# Patient Record
Sex: Male | Born: 1950 | Race: White | Hispanic: No | Marital: Married | Smoking: Never smoker
Health system: Southern US, Community
[De-identification: ages and names within clinical notes are randomized; demographics above are authoritative.]

## PROBLEM LIST (undated history)

## (undated) DIAGNOSIS — Z7982 Long term (current) use of aspirin: Secondary | ICD-10-CM

## (undated) DIAGNOSIS — N529 Male erectile dysfunction, unspecified: Secondary | ICD-10-CM

## (undated) DIAGNOSIS — E785 Hyperlipidemia, unspecified: Secondary | ICD-10-CM

## (undated) DIAGNOSIS — Z972 Presence of dental prosthetic device (complete) (partial): Secondary | ICD-10-CM

## (undated) DIAGNOSIS — D369 Benign neoplasm, unspecified site: Secondary | ICD-10-CM

## (undated) DIAGNOSIS — I10 Essential (primary) hypertension: Secondary | ICD-10-CM

## (undated) DIAGNOSIS — R001 Bradycardia, unspecified: Secondary | ICD-10-CM

## (undated) DIAGNOSIS — K219 Gastro-esophageal reflux disease without esophagitis: Secondary | ICD-10-CM

## (undated) DIAGNOSIS — B029 Zoster without complications: Secondary | ICD-10-CM

## (undated) DIAGNOSIS — I2510 Atherosclerotic heart disease of native coronary artery without angina pectoris: Secondary | ICD-10-CM

## (undated) DIAGNOSIS — N200 Calculus of kidney: Secondary | ICD-10-CM

## (undated) HISTORY — PX: WISDOM TOOTH EXTRACTION: SHX21

## (undated) HISTORY — PX: COLONOSCOPY: SHX174

---

## 1995-08-22 HISTORY — PX: ESOPHAGOGASTRODUODENOSCOPY: SHX1529

## 2018-06-24 ENCOUNTER — Other Ambulatory Visit: Payer: Self-pay | Admitting: Internal Medicine

## 2018-06-24 DIAGNOSIS — R109 Unspecified abdominal pain: Secondary | ICD-10-CM

## 2018-06-24 DIAGNOSIS — R100 Acute abdomen: Secondary | ICD-10-CM

## 2018-06-25 ENCOUNTER — Ambulatory Visit: Payer: BLUE CROSS/BLUE SHIELD

## 2018-07-09 ENCOUNTER — Ambulatory Visit: Payer: Self-pay | Admitting: General Surgery

## 2018-07-16 ENCOUNTER — Ambulatory Visit: Payer: Self-pay | Admitting: General Surgery

## 2018-08-27 ENCOUNTER — Encounter: Payer: Self-pay | Admitting: *Deleted

## 2019-02-14 DIAGNOSIS — Z860101 Personal history of adenomatous and serrated colon polyps: Secondary | ICD-10-CM | POA: Insufficient documentation

## 2019-02-22 ENCOUNTER — Emergency Department: Payer: Medicare Other

## 2019-02-22 ENCOUNTER — Emergency Department
Admission: EM | Admit: 2019-02-22 | Discharge: 2019-02-22 | Disposition: A | Discharge status: Home / Self Care | Payer: Medicare Other | Attending: Emergency Medicine | Admitting: Emergency Medicine

## 2019-02-22 DIAGNOSIS — Y999 Unspecified external cause status: Secondary | ICD-10-CM | POA: Diagnosis not present

## 2019-02-22 DIAGNOSIS — Y9389 Activity, other specified: Secondary | ICD-10-CM | POA: Diagnosis not present

## 2019-02-22 DIAGNOSIS — S43014A Anterior dislocation of right humerus, initial encounter: Secondary | ICD-10-CM

## 2019-02-22 DIAGNOSIS — S4991XA Unspecified injury of right shoulder and upper arm, initial encounter: Secondary | ICD-10-CM | POA: Diagnosis present

## 2019-02-22 DIAGNOSIS — X501XXA Overexertion from prolonged static or awkward postures, initial encounter: Secondary | ICD-10-CM | POA: Insufficient documentation

## 2019-02-22 DIAGNOSIS — Y92814 Boat as the place of occurrence of the external cause: Secondary | ICD-10-CM | POA: Insufficient documentation

## 2019-02-22 MED ORDER — PROPOFOL 10 MG/ML IV BOLUS: 1.0000 mg/kg | Freq: Once | INTRAVENOUS | Status: DC

## 2019-02-22 MED ORDER — ETOMIDATE 2 MG/ML IV SOLN
INTRAVENOUS | Status: AC
  Filled 2019-02-22: qty 10

## 2019-02-22 MED ORDER — FENTANYL CITRATE (PF) 100 MCG/2ML IJ SOLN
50.0000 ug | Freq: Once | INTRAMUSCULAR | Status: AC
  Administered 2019-02-22: 10:00:00 50 ug via INTRAVENOUS
  Filled 2019-02-22: qty 2

## 2019-02-22 MED ORDER — ETOMIDATE 2 MG/ML IV SOLN
INTRAVENOUS | Status: AC | PRN
  Administered 2019-02-22: 15 mg via INTRAVENOUS

## 2019-02-22 MED ORDER — ETOMIDATE 2 MG/ML IV SOLN: 15.0000 mg | Freq: Once | INTRAVENOUS | Status: DC

## 2019-02-22 MED ORDER — PROPOFOL 10 MG/ML IV BOLUS
INTRAVENOUS | Status: AC
  Filled 2019-02-22: qty 20

## 2019-02-22 MED ORDER — NAPROXEN 500 MG PO TABS: 500.0000 mg | ORAL_TABLET | Freq: Two times a day (BID) | ORAL | 0 refills | Status: DC

## 2019-02-22 MED ORDER — SODIUM CHLORIDE 0.9 % IV BOLUS
1000.0000 mL | Freq: Once | INTRAVENOUS | Status: AC
  Administered 2019-02-22: 1000 mL via INTRAVENOUS

## 2019-02-22 MED ORDER — KETOROLAC TROMETHAMINE 30 MG/ML IJ SOLN
15.0000 mg | INTRAMUSCULAR | Status: AC
  Administered 2019-02-22: 13:00:00 15 mg via INTRAVENOUS
  Filled 2019-02-22: qty 1

## 2019-02-22 MED ORDER — HYDROMORPHONE HCL 1 MG/ML IJ SOLN
1.0000 mg | Freq: Once | INTRAMUSCULAR | Status: AC
  Administered 2019-02-22: 11:00:00 1 mg via INTRAVENOUS
  Filled 2019-02-22: qty 1

## 2019-02-22 MED ORDER — PROPOFOL 10 MG/ML IV BOLUS
INTRAVENOUS | Status: AC | PRN
  Administered 2019-02-22: 40 mg via INTRAVENOUS
  Administered 2019-02-22: 80 mg via INTRAVENOUS
  Administered 2019-02-22: 40 mg via INTRAVENOUS

## 2019-02-22 MED ORDER — ONDANSETRON HCL 4 MG/2ML IJ SOLN
4.0000 mg | Freq: Once | INTRAMUSCULAR | Status: AC
  Administered 2019-02-22: 11:00:00 4 mg via INTRAVENOUS
  Filled 2019-02-22: qty 2

## 2019-02-22 NOTE — ED Provider Notes (Signed)
[facility] Provider Note  ____________________________________________  Time seen: Approximately 10:51 AM  I have reviewed the triage vital signs and the nursing notes.   HISTORY  Chief Complaint Shoulder Injury    HPI Kenneth Pena is a [age] y.o. male with no significant medical history who reports reaching out to grab a dock yesterday which caused a pop in the shoulder and sudden severe pain.  Worse with any attempted range of motion, alleviated by holding the arm still.  Pain is severe and nonradiating.  Constant since yesterday.  Denies any history of shoulder injury or dislocation.      History reviewed. No pertinent past medical history. Has medical history noncontributory  There are no active problems to display for this patient.       Prior to Admission medications   Medication Sig Start Date End Date Taking? Authorizing Provider  naproxen (NAPROSYN) 500 MG tablet Take 1 tablet (500 mg total) by mouth 2 (two) times daily with a meal. 02/22/19   Sharman CheekStafford, Germani Gavilanes, MD  None  Allergies Patient has no allergy information on record.   History reviewed. No pertinent family history.  Social History Social History   Tobacco Use  . Smoking status: Not on file  Substance Use Topics  . Alcohol use: Not on file  . Drug use: Not on file  No significant tobacco alcohol or drug use  Review of Systems  Constitutional:   No fever or chills.  ENT:   No sore throat. No rhinorrhea. Cardiovascular:   No chest pain or syncope. Respiratory:   No dyspnea or cough. Gastrointestinal:   Negative for abdominal pain, vomiting and diarrhea.  Musculoskeletal:   Right shoulder pain as above All other systems reviewed and are negative except as documented above in ROS and HPI.  ____________________________________________   PHYSICAL EXAM:  VITAL SIGNS: ED Triage Vitals  Enc Vitals Group     BP 02/22/19 0931 (!) 207/89     Pulse  Rate 02/22/19 0931 (!) 58     Resp 02/22/19 0931 (!) 22     Temp 02/22/19 0931 98 F (36.7 C)     Temp Source 02/22/19 0931 Oral     SpO2 02/22/19 0931 97 %     Weight --      Height 02/22/19 0935 6' (1.829 m)     Head Circumference --      Peak Flow --      Pain Score 02/22/19 0935 10     Pain Loc --      Pain Edu? --      Excl. in GC? --     Vital signs reviewed, nursing assessments reviewed.   Constitutional:   Alert and oriented. Non-toxic appearance. Eyes:   Conjunctivae are normal. EOMI. PERRL. ENT      Head:   Normocephalic and atraumatic.      Nose:   No congestion/rhinnorhea.       Mouth/Throat:   MMM, no pharyngeal erythema. No peritonsillar mass.       Neck:   No meningismus. Full ROM. Hematological/Lymphatic/Immunilogical:   No cervical lymphadenopathy. Cardiovascular:   RRR. Symmetric bilateral radial and DP pulses.  No murmurs. Cap refill less than 2 seconds. Respiratory:   Normal respiratory effort without tachypnea/retractions. Breath sounds are clear and equal bilaterally. No wheezes/rales/rhonchi. Gastrointestinal:   Soft and nontender. Non distended. There is no CVA tenderness.  No rebound, rigidity, or guarding.  Musculoskeletal:   Right shoulder held in  abduction.  Humeral head is palpated anteriorly with emptiness of the glenoid fossa.  No focal bony tenderness. Neurologic:   Normal speech and language.  Motor grossly intact. Intact distal motor function and neurologic function in the right upper extremity.  Axillary nerve function intact. No acute focal neurologic deficits are appreciated.  Skin:    Skin is warm, dry and intact. No rash noted.  No petechiae, purpura, or bullae.  ____________________________________________    LABS (pertinent positives/negatives) (all labs ordered are listed, but only abnormal results are displayed) Labs Reviewed - No data to  display ____________________________________________   EKG    ____________________________________________    RADIOLOGY  Dg Shoulder Right  Result Date: 02/22/2019 CLINICAL DATA:  Post reduction EXAM: RIGHT SHOULDER - 2+ VIEW COMPARISON:  02/22/2019 FINDINGS: Normal alignment following reduction. No acute osseous finding or fracture by plain radiography. Mild AC joint degenerative change. Included right chest unremarkable. IMPRESSION: Adequate shoulder reduction. Electronically Signed   By: Judie PetitM.  Shick M.D.   On: 02/22/2019 12:23   Dg Shoulder Right  Result Date: 02/22/2019 CLINICAL DATA:  Pain and limited range of motion after injury yesterday. EXAM: RIGHT SHOULDER - 2+ VIEW COMPARISON:  None. FINDINGS: Complete anterior dislocation of the humeral head relative to the glenoid. No visible fracture. Other regional bone structures are negative for acute finding. Mild AC joint degenerative change. IMPRESSION: Complete anterior and slightly inferior dislocation of the humeral head relative to the glenoid. Electronically Signed   By: Paulina FusiMark  Shogry M.D.   On: 02/22/2019 10:24    ____________________________________________   PROCEDURES .Sedation  Date/Time: 02/22/2019 10:51 AM Performed by: Sharman CheekStafford, Audrina Marten, MD Authorized by: Sharman CheekStafford, Percilla Tweten, MD   Consent:    Consent obtained:  Verbal   Consent given by:  Patient   Risks discussed:  Allergic reaction, dysrhythmia, inadequate sedation, nausea, prolonged hypoxia resulting in organ damage, prolonged sedation necessitating reversal, respiratory compromise necessitating ventilatory assistance and intubation and vomiting   Alternatives discussed:  Analgesia without sedation, anxiolysis and regional anesthesia Universal protocol:    Procedure explained and questions answered to patient or proxy's satisfaction: yes     Relevant documents present and verified: yes     Test results available and properly labeled: yes     Imaging studies  available: yes     Required blood products, implants, devices, and special equipment available: yes     Site/side marked: yes     Immediately prior to procedure a time out was called: yes     Patient identity confirmation method:  Verbally with patient Indications:    Procedure performed:  Dislocation reduction   Procedure necessitating sedation performed by:  Physician performing sedation Pre-sedation assessment:    Time since last food or drink:  12+ hours   ASA classification: class 1 - normal, healthy patient     Neck mobility: normal     Mouth opening:  3 or more finger widths   Thyromental distance:  4 finger widths   Mallampati score:  I - soft palate, uvula, fauces, pillars visible   Pre-sedation assessments completed and reviewed: airway patency, cardiovascular function, hydration status, mental status, nausea/vomiting, pain level, respiratory function and temperature     Pre-sedation assessment completed:  02/22/2019 10:51 AM Immediate pre-procedure details:    Reassessment: Patient reassessed immediately prior to procedure     Reviewed: vital signs, relevant labs/tests and NPO status     Verified: bag valve mask available, emergency equipment available, intubation equipment available, IV patency confirmed, oxygen available  and suction available   Procedure details (see MAR for exact dosages):    Preoxygenation:  Nasal cannula   Sedation:  Propofol and etomidate   Analgesia:  Hydromorphone   Intra-procedure monitoring:  Blood pressure monitoring, cardiac monitor, continuous pulse oximetry, frequent LOC assessments, frequent vital sign checks and continuous capnometry   Intra-procedure events: none     Intra-procedure management:  Supplemental oxygen (2L  initiated due to so2 decreasing to 92% RA)   Total Provider sedation time (minutes):  25 Post-procedure details:    Post-sedation assessment completed:  02/22/2019 12:08 PM   Attendance: Constant attendance by certified staff  until patient recovered     Recovery: Patient returned to pre-procedure baseline     Post-sedation assessments completed and reviewed: airway patency, cardiovascular function, hydration status, mental status, nausea/vomiting, pain level, respiratory function and temperature     Patient is stable for discharge or admission: yes     Patient tolerance:  Tolerated well, no immediate complications Comments:     Minimal response to propofol in escalating doses totaling 2 mg/kg, so patient was given 15 mg of IV etomidate with adequate sedation.    [facility] Passey Injury Treatment  Date/Time: 02/22/2019 10:52 AM Performed by: Carrie Mew, MD Authorized by: Carrie Mew, MD   Consent:    Consent obtained:  Verbal   Consent given by:  Patient   Risks discussed:  Fracture, nerve damage, restricted joint movement, vascular damage, irreducible dislocation, stiffness and recurrent dislocation   Alternatives discussed:  Referral, immobilization and delayed treatmentInjury location: shoulder Location details: right shoulder Injury type: dislocation Dislocation type: anterior Hill-Sachs deformity: no Chronicity: new Pre-procedure neurovascular assessment: neurovascularly intact Pre-procedure distal perfusion: normal Pre-procedure neurological function: normal Pre-procedure range of motion: reduced  Anesthesia: Local anesthesia used: no  Patient sedated: Yes. Refer to sedation procedure documentation for details of sedation. Manipulation performed: yes Reduction method: traction and counter traction and external rotation Immobilization: sling (sling/immobilizer) Post-procedure neurovascular assessment: post-procedure neurovascularly intact Post-procedure distal perfusion: normal Post-procedure neurological function: normal Post-procedure range of motion: normal Patient tolerance: patient tolerated the procedure well with no immediate  complications     ____________________________________________    CLINICAL IMPRESSION / ASSESSMENT AND PLAN / ED COURSE  Medications ordered in the ED: Medications  propofol (DIPRIVAN) 10 mg/mL bolus/IV push 79.4 mg (has no administration in time range)  etomidate (AMIDATE) injection 15 mg (has no administration in time range)  fentaNYL (SUBLIMAZE) injection 50 mcg (50 mcg Intravenous Given 02/22/19 0954)  HYDROmorphone (DILAUDID) injection 1 mg (1 mg Intravenous Given 02/22/19 1118)  sodium chloride 0.9 % bolus 1,000 mL (0 mLs Intravenous Stopped 02/22/19 1217)  ondansetron (ZOFRAN) injection 4 mg (4 mg Intravenous Given 02/22/19 1118)  propofol (DIPRIVAN) 10 mg/mL bolus/IV push (40 mg Intravenous Given 02/22/19 1146)  etomidate (AMIDATE) injection (15 mg Intravenous Given 02/22/19 1153)  ketorolac (TORADOL) 30 MG/ML injection 15 mg (15 mg Intravenous Given 02/22/19 1246)    Pertinent labs & imaging results that were available during my care of the patient were reviewed by me and considered in my medical decision making (see chart for details).  DOMONIC KIMBALL was evaluated in Emergency Department on 02/22/2019 for the symptoms described in the history of present illness. He was evaluated in the context of the global COVID-19 pandemic, which necessitated consideration that the patient might be at risk for infection with the SARS-CoV-2 virus that causes COVID-19. Institutional protocols and algorithms that pertain to the evaluation of patients at risk for COVID-19 are  in a state of rapid change based on information released by regulatory bodies including the CDC and federal and state organizations. These policies and algorithms were followed during the patient's care in the ED.   Patient presents with anterior dislocation of right shoulder.  Consented for deep sedation and reduction.  Clinical Course as of Feb 22 1324  Sat Feb 22, 2019  1233 Repeat shoulder x-ray viewed by me, shows adequate  reduction as confirmed by radiology.  Shoulder joint is located.  There does appear to be increased acromiohumeral separation superiorly suggestive of ligamentous laxity or disruption.  He will need to follow-up with orthopedics.   [PS]    Clinical Course User Index [PS] Sharman CheekStafford, Taha Dimond, MD     ----------------------------------------- 1:25 PM on 02/22/2019 -----------------------------------------  Back to baseline, stable for discharge, Post sedation discharge instructions given.  Immobilizer on for a week continuously, follow-up with orthopedics.  NSAIDs for pain.  ____________________________________________   FINAL CLINICAL IMPRESSION(S) / ED DIAGNOSES    Final diagnoses:  Closed anterior dislocation of right shoulder, initial encounter     ED Discharge Orders         Ordered    naproxen (NAPROSYN) 500 MG tablet  2 times daily with meals     02/22/19 1320          Portions of this note were generated with dragon dictation software. Dictation errors may occur despite best attempts at proofreading.   Sharman CheekStafford, Sybil Shrader, MD 02/22/19 1326

## 2019-02-22 NOTE — Sedation Documentation (Signed)
Les, EDT at bedside to assist with procedure

## 2019-02-22 NOTE — ED Triage Notes (Signed)
Pt arrives POV to ED with c/o right shoulder pain.pt reports being on a boat and trying to grab the dock as the boat came in. Pt states his shoulder popped and has been hurting since yesterday. Pt woke up this morning unable to hold his arm in place and feels like his shoulder is out of socket. Pt is in immense pain at this time.

## 2019-02-22 NOTE — Discharge Instructions (Signed)
Keep the shoulder immobilizer on at all times for the next week and follow up with orthopedics for further evaluation.  You may remove the immobilizer to bathe, but you should not attempt to raise your arm, and instead allow it to hang by your side while you bathe.

## 2019-02-22 NOTE — Sedation Documentation (Signed)
Called and updated pt's fiance, Katharine Look.

## 2019-02-22 NOTE — Sedation Documentation (Signed)
X-ray at bedside

## 2019-02-22 NOTE — Sedation Documentation (Signed)
Patient alert and talking with RN. Pt is in NAD at this time. Pt reports that his pain is better, still having some numbness in his fingers.

## 2019-02-22 NOTE — ED Notes (Addendum)
Pt reports that he was trying to grab the dock as boat was docking, pt reports as soon as he put pressure on his arm he felt something pop in his shoulder. Pt states that he is having severe pain in his shoulder and that the pain medication he was given by flex RN is not helping with his pain. Pt is in bed with arm elevated at this time, does not appear to be in any distress. Skin color is WNL, radial pulse present and strong, skin is warm and dry.

## 2019-03-05 ENCOUNTER — Other Ambulatory Visit: Payer: Self-pay | Admitting: Orthopedic Surgery

## 2019-03-05 DIAGNOSIS — M25511 Pain in right shoulder: Secondary | ICD-10-CM

## 2019-03-18 ENCOUNTER — Other Ambulatory Visit: Payer: Self-pay

## 2019-03-18 ENCOUNTER — Ambulatory Visit
Admission: RE | Admit: 2019-03-18 | Discharge: 2019-03-18 | Disposition: A | Discharge status: Home / Self Care | Payer: Medicare Other | Source: Ambulatory Visit | Attending: Orthopedic Surgery | Admitting: Orthopedic Surgery

## 2019-03-18 DIAGNOSIS — M25511 Pain in right shoulder: Secondary | ICD-10-CM | POA: Diagnosis present

## 2019-03-24 ENCOUNTER — Other Ambulatory Visit: Payer: Self-pay

## 2019-03-24 ENCOUNTER — Encounter: Payer: Self-pay | Admitting: *Deleted

## 2019-03-25 ENCOUNTER — Other Ambulatory Visit
Admission: RE | Admit: 2019-03-25 | Discharge: 2019-03-25 | Disposition: A | Discharge status: Home / Self Care | Payer: Medicare Other | Source: Ambulatory Visit | Attending: Orthopedic Surgery | Admitting: Orthopedic Surgery

## 2019-03-25 DIAGNOSIS — Z01812 Encounter for preprocedural laboratory examination: Secondary | ICD-10-CM | POA: Diagnosis present

## 2019-03-25 DIAGNOSIS — Z20828 Contact with and (suspected) exposure to other viral communicable diseases: Secondary | ICD-10-CM | POA: Insufficient documentation

## 2019-03-25 LAB — SARS CORONAVIRUS 2 (TAT 6-24 HRS): SARS Coronavirus 2: NEGATIVE

## 2019-03-25 NOTE — Anesthesia Preprocedure Evaluation (Addendum)
Anesthesia Evaluation  Patient identified by MRN, date of birth, ID band Patient awake    Reviewed: Allergy & Precautions, NPO status , Patient's Chart, lab work & pertinent test results  History of Anesthesia Complications Negative for: history of anesthetic complications  Airway Mallampati: II   Neck ROM: Full    Dental  (+) Implants   Pulmonary neg pulmonary ROS,    Pulmonary exam normal breath sounds clear to auscultation       Cardiovascular Exercise Tolerance: Good negative cardio ROS Normal cardiovascular exam Rhythm:Regular Rate:Normal     Neuro/Psych negative neurological ROS     GI/Hepatic negative GI ROS,   Endo/Other  negative endocrine ROS  Renal/GU Renal disease (hx nephrolithiasis)     Musculoskeletal   Abdominal   Peds  Hematology negative hematology ROS (+)   Anesthesia Other Findings   Reproductive/Obstetrics                            Anesthesia Physical Anesthesia Plan  ASA: I  Anesthesia Plan: General and Regional   Post-op Pain Management:  Regional for Post-op pain and GA combined w/ Regional for post-op pain   Induction: Intravenous  PONV Risk Score and Plan: 2 and Dexamethasone and Ondansetron  Airway Management Planned: Oral ETT  Additional Equipment:   Intra-op Plan:   Post-operative Plan: Extubation in OR  Informed Consent: I have reviewed the patients History and Physical, chart, labs and discussed the procedure including the risks, benefits and alternatives for the proposed anesthesia with the patient or authorized representative who has indicated his/her understanding and acceptance.       Plan Discussed with: CRNA  Anesthesia Plan Comments: (Patient hypertensive in preop to 189/99 with HR 50.  Giving hydralazine.)       Anesthesia Quick Evaluation

## 2019-03-28 ENCOUNTER — Ambulatory Visit: Payer: Medicare Other | Admitting: Anesthesiology

## 2019-03-28 ENCOUNTER — Encounter
Admission: RE | Disposition: A | Discharge status: Home / Self Care | Payer: Self-pay | Source: Home / Self Care | Attending: Orthopedic Surgery

## 2019-03-28 ENCOUNTER — Ambulatory Visit
Admission: RE | Admit: 2019-03-28 | Discharge: 2019-03-28 | Disposition: A | Discharge status: Home / Self Care | Payer: Medicare Other | Attending: Orthopedic Surgery | Admitting: Orthopedic Surgery

## 2019-03-28 DIAGNOSIS — X509XXA Other and unspecified overexertion or strenuous movements or postures, initial encounter: Secondary | ICD-10-CM | POA: Insufficient documentation

## 2019-03-28 DIAGNOSIS — S43491A Other sprain of right shoulder joint, initial encounter: Secondary | ICD-10-CM | POA: Diagnosis not present

## 2019-03-28 DIAGNOSIS — Z8249 Family history of ischemic heart disease and other diseases of the circulatory system: Secondary | ICD-10-CM | POA: Diagnosis not present

## 2019-03-28 DIAGNOSIS — Z87442 Personal history of urinary calculi: Secondary | ICD-10-CM | POA: Diagnosis not present

## 2019-03-28 DIAGNOSIS — M7541 Impingement syndrome of right shoulder: Secondary | ICD-10-CM | POA: Diagnosis not present

## 2019-03-28 DIAGNOSIS — M19011 Primary osteoarthritis, right shoulder: Secondary | ICD-10-CM | POA: Insufficient documentation

## 2019-03-28 DIAGNOSIS — Y9239 Other specified sports and athletic area as the place of occurrence of the external cause: Secondary | ICD-10-CM | POA: Diagnosis not present

## 2019-03-28 DIAGNOSIS — M75101 Unspecified rotator cuff tear or rupture of right shoulder, not specified as traumatic: Secondary | ICD-10-CM | POA: Diagnosis present

## 2019-03-28 DIAGNOSIS — Z86010 Personal history of colonic polyps: Secondary | ICD-10-CM | POA: Insufficient documentation

## 2019-03-28 DIAGNOSIS — M1990 Unspecified osteoarthritis, unspecified site: Secondary | ICD-10-CM | POA: Insufficient documentation

## 2019-03-28 DIAGNOSIS — Z79899 Other long term (current) drug therapy: Secondary | ICD-10-CM | POA: Insufficient documentation

## 2019-03-28 DIAGNOSIS — S46011A Strain of muscle(s) and tendon(s) of the rotator cuff of right shoulder, initial encounter: Secondary | ICD-10-CM | POA: Diagnosis not present

## 2019-03-28 HISTORY — DX: Calculus of kidney: N20.0

## 2019-03-28 HISTORY — DX: Presence of dental prosthetic device (complete) (partial): Z97.2

## 2019-03-28 HISTORY — PX: SHOULDER ARTHROSCOPY WITH OPEN ROTATOR CUFF REPAIR: SHX6092

## 2019-03-28 SURGERY — ARTHROSCOPY, SHOULDER WITH REPAIR, ROTATOR CUFF, OPEN
Anesthesia: Regional | Site: Shoulder | Laterality: Right

## 2019-03-28 MED ORDER — GLYCOPYRROLATE 0.2 MG/ML IJ SOLN
INTRAMUSCULAR | Status: DC | PRN
  Administered 2019-03-28: 0.2 mg via INTRAVENOUS

## 2019-03-28 MED ORDER — CEFAZOLIN SODIUM-DEXTROSE 2-4 GM/100ML-% IV SOLN
2.0000 g | Freq: Once | INTRAVENOUS | Status: AC
  Administered 2019-03-28: 2 g via INTRAVENOUS

## 2019-03-28 MED ORDER — FENTANYL CITRATE (PF) 100 MCG/2ML IJ SOLN: 25.0000 ug | INTRAMUSCULAR | Status: DC | PRN

## 2019-03-28 MED ORDER — OXYCODONE HCL 5 MG PO TABS: 5.0000 mg | ORAL_TABLET | ORAL | 0 refills | Status: AC | PRN

## 2019-03-28 MED ORDER — ONDANSETRON HCL 4 MG/2ML IJ SOLN: 4.0000 mg | Freq: Once | INTRAMUSCULAR | Status: DC | PRN

## 2019-03-28 MED ORDER — DEXMEDETOMIDINE HCL 200 MCG/2ML IV SOLN
INTRAVENOUS | Status: DC | PRN
  Administered 2019-03-28: 10 ug via INTRAVENOUS

## 2019-03-28 MED ORDER — ACETAMINOPHEN 500 MG PO TABS: 1000.0000 mg | ORAL_TABLET | Freq: Three times a day (TID) | ORAL | 2 refills | Status: AC

## 2019-03-28 MED ORDER — ONDANSETRON HCL 4 MG/2ML IJ SOLN
INTRAMUSCULAR | Status: DC | PRN
  Administered 2019-03-28: 4 mg via INTRAVENOUS

## 2019-03-28 MED ORDER — HYDRALAZINE HCL 20 MG/ML IJ SOLN
10.0000 mg | Freq: Once | INTRAMUSCULAR | Status: AC
  Administered 2019-03-28: 10 mg via INTRAVENOUS

## 2019-03-28 MED ORDER — PROPOFOL 10 MG/ML IV BOLUS
INTRAVENOUS | Status: DC | PRN
  Administered 2019-03-28: 150 mg via INTRAVENOUS
  Administered 2019-03-28: 50 mg via INTRAVENOUS

## 2019-03-28 MED ORDER — MIDAZOLAM HCL 5 MG/5ML IJ SOLN
INTRAMUSCULAR | Status: DC | PRN
  Administered 2019-03-28: 2 mg via INTRAVENOUS

## 2019-03-28 MED ORDER — PHENYLEPHRINE HCL (PRESSORS) 10 MG/ML IV SOLN
INTRAVENOUS | Status: DC | PRN
  Administered 2019-03-28: 50 ug via INTRAVENOUS
  Administered 2019-03-28 (×3): 100 ug via INTRAVENOUS

## 2019-03-28 MED ORDER — LACTATED RINGERS IV SOLN
INTRAVENOUS | Status: DC | PRN
  Administered 2019-03-28: 4 mL

## 2019-03-28 MED ORDER — ONDANSETRON 4 MG PO TBDP: 4.0000 mg | ORAL_TABLET | Freq: Three times a day (TID) | ORAL | 0 refills | Status: DC | PRN

## 2019-03-28 MED ORDER — ACETAMINOPHEN 10 MG/ML IV SOLN: 1000.0000 mg | Freq: Once | INTRAVENOUS | Status: DC | PRN

## 2019-03-28 MED ORDER — OXYCODONE HCL 5 MG/5ML PO SOLN: 5.0000 mg | Freq: Once | ORAL | Status: AC | PRN

## 2019-03-28 MED ORDER — FENTANYL CITRATE (PF) 100 MCG/2ML IJ SOLN
INTRAMUSCULAR | Status: DC | PRN
  Administered 2019-03-28: 50 ug via INTRAVENOUS

## 2019-03-28 MED ORDER — BUPIVACAINE LIPOSOME 1.3 % IJ SUSP
INTRAMUSCULAR | Status: DC | PRN
  Administered 2019-03-28: 20 mL

## 2019-03-28 MED ORDER — ROPIVACAINE HCL 5 MG/ML IJ SOLN
INTRAMUSCULAR | Status: DC | PRN
  Administered 2019-03-28: 10 mL via EPIDURAL

## 2019-03-28 MED ORDER — LACTATED RINGERS IV SOLN
INTRAVENOUS | Status: DC
  Administered 2019-03-28: 07:00:00 via INTRAVENOUS

## 2019-03-28 MED ORDER — ASPIRIN EC 325 MG PO TBEC: 325.0000 mg | DELAYED_RELEASE_TABLET | Freq: Every day | ORAL | 0 refills | Status: AC

## 2019-03-28 MED ORDER — LACTATED RINGERS IV SOLN: INTRAVENOUS | Status: DC

## 2019-03-28 MED ORDER — OXYCODONE HCL 5 MG PO TABS
5.0000 mg | ORAL_TABLET | Freq: Once | ORAL | Status: AC | PRN
  Administered 2019-03-28: 12:00:00 5 mg via ORAL

## 2019-03-28 SURGICAL SUPPLY — 63 items
ADAPTER IRRIG TUBE 2 SPIKE SOL (ADAPTER) ×4 IMPLANT
ANCHOR ICONIX SPEED 2.3 (Anchor) ×1 IMPLANT
BUR BR 5.5 12 FLUTE (BURR) ×1 IMPLANT
BUR RADIUS 4.0X18.5 (BURR) ×1 IMPLANT
CANNULA PART THRD DISP 5.75X7 (CANNULA) ×1 IMPLANT
CANNULA PARTIAL THREAD 2X7 (CANNULA) IMPLANT
CANNULA TWIST IN 8.25X9CM (CANNULA) IMPLANT
CHLORAPREP W/TINT 26 (MISCELLANEOUS) ×2 IMPLANT
COOLER POLAR GLACIER W/PUMP (MISCELLANEOUS) ×2 IMPLANT
COVER LIGHT HANDLE UNIVERSAL (MISCELLANEOUS) ×4 IMPLANT
DERMABOND ADVANCED (GAUZE/BANDAGES/DRESSINGS)
DERMABOND ADVANCED .7 DNX12 (GAUZE/BANDAGES/DRESSINGS) IMPLANT
DRAPE IMP U-DRAPE 54X76 (DRAPES) ×3 IMPLANT
DRAPE INCISE IOBAN 66X45 STRL (DRAPES) ×2 IMPLANT
DRAPE SHEET LG 3/4 BI-LAMINATE (DRAPES) ×2 IMPLANT
DRAPE U-SHAPE 48X52 POLY STRL (PACKS) ×4 IMPLANT
ELECT REM PT RETURN 9FT ADLT (ELECTROSURGICAL) ×2
ELECTRODE REM PT RTRN 9FT ADLT (ELECTROSURGICAL) ×1 IMPLANT
GAUZE SPONGE 4X4 12PLY STRL (GAUZE/BANDAGES/DRESSINGS) ×2 IMPLANT
GAUZE XEROFORM 1X8 LF (GAUZE/BANDAGES/DRESSINGS) ×2 IMPLANT
GLOVE BIOGEL PI IND STRL 8 (GLOVE) ×1 IMPLANT
GLOVE BIOGEL PI INDICATOR 8 (GLOVE) ×4
GOWN STRL REUS W/ TWL LRG LVL3 (GOWN DISPOSABLE) ×1 IMPLANT
GOWN STRL REUS W/TWL LRG LVL3 (GOWN DISPOSABLE) ×3
IMP SYSTEM BRIDGE 4.75X19.1 (Anchor) ×2 IMPLANT
IMPL SYSTEM BRIDGE 4.75X19.1 (Anchor) IMPLANT
IV LACTATED RINGER IRRG 3000ML (IV SOLUTION) ×16
IV LR IRRIG 3000ML ARTHROMATIC (IV SOLUTION) ×8 IMPLANT
KIT STABILIZATION SHOULDER (MISCELLANEOUS) ×2 IMPLANT
KIT TURNOVER KIT A (KITS) ×2 IMPLANT
MANIFOLD 4PT FOR NEPTUNE1 (MISCELLANEOUS) ×2 IMPLANT
MASK FACE SPIDER DISP (MASK) ×2 IMPLANT
MAT ABSORB  FLUID 56X50 GRAY (MISCELLANEOUS) ×2
MAT ABSORB FLUID 56X50 GRAY (MISCELLANEOUS) ×2 IMPLANT
NDL SAFETY ECLIPSE 18X1.5 (NEEDLE) ×1 IMPLANT
NDL SCORPION MULTI FIRE (NEEDLE) IMPLANT
NEEDLE HYPO 18GX1.5 SHARP (NEEDLE) ×1
NEEDLE SCORPION MULTI FIRE (NEEDLE) ×2 IMPLANT
PACK ARTHROSCOPY SHOULDER (MISCELLANEOUS) ×2 IMPLANT
PAD WRAPON POLAR SHDR UNIV (MISCELLANEOUS) ×1 IMPLANT
PENCIL SMOKE EVACUATOR (MISCELLANEOUS) ×2 IMPLANT
SET TUBE SUCT SHAVER OUTFL 24K (TUBING) ×2 IMPLANT
SET TUBE TIP INTRA-ARTICULAR (MISCELLANEOUS) ×2 IMPLANT
STAPLER SKIN PROX 35W (STAPLE) IMPLANT
STRIP CLOSURE SKIN 1/2X4 (GAUZE/BANDAGES/DRESSINGS) IMPLANT
SUT ETHILON 3-0 (SUTURE) ×1 IMPLANT
SUT LASSO 90 DEG SD STR (SUTURE) IMPLANT
SUT MNCRL 4-0 (SUTURE) ×1
SUT MNCRL 4-0 27XMFL (SUTURE) ×1
SUT TICRON 2-0 30IN 311381 (SUTURE) IMPLANT
SUT VIC AB 0 CT1 36 (SUTURE) ×1 IMPLANT
SUT VIC AB 2-0 CT2 27 (SUTURE) ×1 IMPLANT
SUT VICRYL 3-0 27IN (SUTURE) IMPLANT
SUTURE MNCRL 4-0 27XMF (SUTURE) IMPLANT
SUTURE TAPE 1.3 40 TPR END (SUTURE) IMPLANT
SUTURETAPE 1.3 40 TPR END (SUTURE) ×8
SYR 10ML LL (SYRINGE) ×2 IMPLANT
SYSTEM FBRTK BICEPS 1.9 DRILL (Anchor) ×1 IMPLANT
TAPE MICROFOAM 4IN (TAPE) ×2 IMPLANT
TUBING ARTHRO INFLOW-ONLY STRL (TUBING) ×2 IMPLANT
TUBING CONNECTING 10 (TUBING) ×2 IMPLANT
WAND WEREWOLF FLOW 90D (MISCELLANEOUS) ×2 IMPLANT
WRAPON POLAR PAD SHDR UNIV (MISCELLANEOUS) ×2

## 2019-03-28 NOTE — Progress Notes (Signed)
Assisted Andrea Mazzoni, ANMD with right, ultrasound guided, supraclavicular block. Side rails up, monitors on throughout procedure. See vital signs in flow sheet. Tolerated Procedure well.  

## 2019-03-28 NOTE — Op Note (Signed)
SURGERY DATE: 03/28/2019  PRE-OP DIAGNOSIS:  1. Right subacromial impingement 2. Right biceps tendinopathy 3. Right rotator cuff tear 4. Right acromioclavicular joint osteoarthritis  POST-OP DIAGNOSIS: 1. Right subacromial impingement 2. Right biceps tendinopathy 3. Right rotator cuff tear (supraspinatus and infraspinatus) 4. Right acromioclavicular joint osteoarthritis  PROCEDURES:  1. Right mini-open rotator cuff repair 2. Right open biceps tenodesis 3. Right arthroscopic distal clavicle excision 4. Right arthroscopic extensive debridement of shoulder (glenohumeral and subacromial spaces) 5. Right arthroscopic subacromial decompression  SURGEON: Cato Mulligan, MD  ASSISTANT: Anitra Lauth, PA  ANESTHESIA: Gen with Exparil interscalene block  ESTIMATED BLOOD LOSS: 25cc  DRAINS:  none  TOTAL IV FLUIDS: per anesthesia   SPECIMENS: none  IMPLANTS:  - Arthrex 4.67m SwiveLock x4 - Arthrex 1.829mFiberTak - Iconix SPEED double loaded with 1.2 and 2.74m99mape x 1   OPERATIVE FINDINGS:  Examination under anesthesia: A careful examination under anesthesia was performed.  Passive range of motion was: FF: 150; ER at side: 45; ER in abduction: 80; IR in abduction: 40.  Anterior load shift: NT.  Posterior load shift: NT.  Sulcus in neutral: NT.  Sulcus in ER: NT.    Intra-operative findings: A thorough arthroscopic examination of the shoulder was performed.  The findings are: 1. Biceps tendon: tendinopathy with significant erythema and thickening proximally 2. Superior labrum: injected with surrounding synovitis, type II SLAP tear 3. Posterior labrum and capsule: normal 4. Inferior capsule and inferior recess: normal 5. Glenoid cartilage surface: Grade 1 changes with mild surface fibrillation  6. Supraspinatus attachment: full-thickness tear 7. Posterior rotator cuff attachment: Full-thickness tear of the infraspinatus 8. Humeral head articular cartilage: Grade 2 degenerative  changes, small Hill-Sachs lesion present 9. Rotator interval: significant synovitis 10: Subscapularis tendon: attachment intact 11. Anterior labrum: Torn and degenerative appearing 12. IGHL: normal  OPERATIVE REPORT:   Indications for procedure: RanMORIAH LOUGHRY a [age]o. male who sustained a glenohumeral joint dislocation on 02/22/2019.  The shoulder was reduced in the emergency department under sedation.  However on repeat evaluation, the patient had inability to lift his arm over his head and significant weakness with external rotation with positive external rotation lag sign.  An MRI was obtained that showed a massive rotator cuff tear involving the supraspinatus and infraspinatus.  Additionally, there was concern for biceps tendinopathy, symptomatic AC joint arthritis, and subacromial impingement. After discussion of risks, benefits, and alternatives to surgery, the patient elected to proceed.   Procedure in detail:  I identified RanREG BIRCHER the pre-operative holding area.  I marked the operative shoulder with my initials. I reviewed the risks and benefits of the proposed surgical intervention, and the patient (and/or patient's guardian) wished to proceed.  Anesthesia was then performed with an Exparil interscalene block.  The patient was transferred to the operative suite and placed in the beach chair position.    SCDs were placed on the lower extremities. Appropriate IV antibiotics were administered prior to incision. The operative upper extremity was then prepped and draped in standard fashion. A time out was performed confirming the correct extremity, correct patient, and correct procedure.   I then created a standard posterior portal with an 11 blade. The glenohumeral joint was easily entered with a blunt trochar and the arthroscope introduced. The findings of diagnostic arthroscopy are described above. I debrided degenerative tissue including the synovitic tissue about the rotator  interval and anterior and superior labrum. I then coagulated the inflamed synovium to obtain  hemostasis and reduce the risk of post-operative swelling using an Arthrocare radiofrequency device. I performed a biceps tenotomy using an arthroscopic scissors and used a motorized shaver to debride the stump back to a stable base.   Next, the arthroscope was then introduced into the subacromial space. A direct lateral portal was created with an 11-blade after spinal needle localization. An extensive subacromial bursectomy was performed using a combination of the shaver and Arthrocare wand. The entire acromial undersurface was exposed and the CA ligament was subperiosteally elevated to expose the anterior acromial hook. A 5.69m barrel burr was used to create a flat anterior and lateral aspect of the acromion, converting it from a Type 2 to a Type 1 acromion. Care was made to keep the deltoid fascia intact.  I then turned my attention to the arthroscopic distal clavicle excision. I identified the acromioclavicular joint. Surrounding bursal tissue was debrided and the edges of the joint were identified. I used the 5.54mbarrel burr to remove the distal clavicle parallel to the edge of the acromion. I was able to fit two widths of the burr into the space between the distal clavicle and acromion, signifying that I had removed ~1144mf distal clavicle. This was confirmed by viewing anteriorly and introducing a probe with measuring marks from the lateral portal. Hemostasis was achieved with an Arthrocare wand. Fluid was evacuated from the shoulder.  This concluded the arthroscopic portion of the procedure.  A longitudinal incision from the anterolateral acromion ~7cm in length was made overlying the raphe between the anterior and middle heads of the deltoid. The raphe was identified and it was incised. The subacromial space was identified. Any remaining bursa was excised. The rotator cuff tear was identified.  There was an  additional, more medial tear such that the lateral aspect of the tendon was a very poor quality.  This portion was excised as it was unlikely to heal.  This resulted in deep V-shaped tear with medial extent medial to the apex of the humeral head.  We then turned our attention to the biceps tenodesis. The arm was externally rotated.  The bicipital groove was identified.  A 15 blade was used to make a cut overlying the biceps tendon, and the tendon was removed using a right angle clamp.  The base of the bicipital groove was identified and cleared of soft tissue.  A FiberTak anchor was placed in the bicipital groove.  The biceps tendon was held at the appropriate amount of tension.  One set of sutures was passed through the biceps anchor with one limb passed in a simple fashion and the second limb passed in a simple plus locking stitch pattern.  This was repeated for the other set of sutures.  This construct allowed for shuttling the biceps tendon down to the bone.  The sutures were tied and cut.  The diseased portion of the proximal biceps was then excised.  The arm was then internally rotated.  The rotator cuff footprint was cleared of soft tissue. A rongeur was used to gently decorticate the rotator cuff footprint to allow for improved healing.  The rotator cuff was mobilized using key elevators both superior and inferior to the tear. Two margin suture convergence sutures were placed at the apex of the V shaped tear of the rotator cuff using suture tape.  Next medial row anchors from a SpeedBridge kit that were additionally loaded with suture tape were placed at the anterior and posterior aspects of the tear at the  articular margin.  Additionally, one Iconix SPEED anchor was placed centrally.   The #2 FiberWire sutures from both medial row SwiveLock anchors were removed and used to place additional margin convergence sutures more laterally.  This appropriately converted the tear to a U-shaped tear that could be  appropriately reduced to its footprint without undue tension.  All of the remaining sutures were passed appropriately through the rotator cuff.  The posterior anchor suture tapes were tied to reapproximate the infraspinatus down to the anchor.  Two SwiveLock anchors were placed for the lateral row anchors with remaining limbs of suture passed in a fashion that allowed for optimal reduction of the rotator cuff with appropriate compression. The anterior anchor #2 FiberWire from the SwiveLock anchor was additionally passed through a dog ear in the anterior aspect of the rotator cuff to further reapproximate the tear.  This construct was stable with external and internal rotation and allowed for appropriate reapproximation of the rotator cuff to its natural footprint..  The wound was thoroughly irrigated.  The deltoid split was closed with 0 Vicryl.  The subdermal layer was closed with 2-0 Vicryl.  The skin was closed with 4-0 Monocryl and Dermabond. The portals were closed with 3-0 Nylon. Xeroform was applied to the incisions. A sterile dressing was applied, followed by a Polar Care sleeve and a SlingShot shoulder immobilizer/sling. The patient was awakened from anesthesia without difficulty and was transferred to the PACU in stable condition.   Of note, assistance from a PA was essential to performing the surgery. PA assisted with patient positioning, retraction, and instrumentation. The surgery would have been more difficult and had longer operative time without PA assistance.   Additionally, this case had increased complexity compared to standard mini open rotator cuff repair given the more medial tear rendering the lateral most rotator cuff tissue unusable.  This required increased mobilization of the rotator cuff and additional margin convergence suturing.  Additionally, the large size of the tear required a third medial row anchor.  All of these factors increased surgical complexity and surgical time by  approximately 1 hour.   COMPLICATIONS: none  DISPOSITION: plan for discharge home after recovery in PACU   POSTOPERATIVE PLAN: Remain in sling (except hygiene and elbow/wrist/hand RoM exercises as instructed by PT) x 6 weeks and NWB for this time. PT to begin 3-4 days after surgery.  Use large/massive rotator cuff repair and biceps tenodesis rehab protocol. ASA '325mg'$  daily x 2 weeks for DVT ppx.

## 2019-03-28 NOTE — H&P (Signed)
Paper H&P to be scanned into permanent record. H&P reviewed. No significant changes noted.  

## 2019-03-28 NOTE — Anesthesia Postprocedure Evaluation (Signed)
Anesthesia Post Note  Patient: Kenneth Pena  Procedure(s) Performed: SHOULDER ARTHROSCOPIC SUBSCAPULARIS REPAIR, MINI OPEN ROTATOR CUFF REPAIR, SUBACROMIAL DECOMPRESSION, DISTAL CLAVICLE EXCISION AND BICEPS TENODESIS (Right Shoulder)  Patient location during evaluation: PACU Anesthesia Type: Regional Level of consciousness: awake and alert, oriented and patient cooperative Pain management: pain level controlled Vital Signs Assessment: post-procedure vital signs reviewed and stable Respiratory status: spontaneous breathing, nonlabored ventilation and respiratory function stable Cardiovascular status: blood pressure returned to baseline and stable Postop Assessment: adequate PO intake Anesthetic complications: no    Darrin Nipper

## 2019-03-28 NOTE — Transfer of Care (Signed)
Immediate Anesthesia Transfer of Care Note  Patient: DAILEY BUCCHERI  Procedure(s) Performed: SHOULDER ARTHROSCOPIC SUBSCAPULARIS REPAIR, MINI OPEN ROTATOR CUFF REPAIR, SUBACROMIAL DECOMPRESSION, DISTAL CLAVICLE EXCISION AND BICEPS TENODESIS (Right Shoulder)  Patient Location: PACU  Anesthesia Type: General, Regional  Level of Consciousness: awake, alert  and patient cooperative  Airway and Oxygen Therapy: Patient Spontanous Breathing and Patient connected to supplemental oxygen  Post-op Assessment: Post-op Vital signs reviewed, Patient's Cardiovascular Status Stable, Respiratory Function Stable, Patent Airway and No signs of Nausea or vomiting  Post-op Vital Signs: Reviewed and stable  Complications: No apparent anesthesia complications

## 2019-03-28 NOTE — Discharge Instructions (Signed)
Post-Op Instructions - Rotator Cuff Repair ° °1. Bracing: You will wear a shoulder immobilizer or sling for 6 weeks.  ° °2. Driving: No driving for 3 weeks post-op. When driving, do not wear the immobilizer. Ideally, we recommend no driving for 6 weeks while sling is in place as one arm will be immobilized.  ° °3. Activity: No active lifting for 2 months. Wrist, hand, and elbow motion only. Avoid lifting the upper arm away from the body except for hygiene. You are permitted to bend and straighten the elbow passively only (no active elbow motion). You may use your hand and wrist for typing, writing, and managing utensils (cutting food). Do not lift more than a coffee cup for 8 weeks.  When sleeping or resting, inclined positions (recliner chair or wedge pillow) and a pillow under the forearm for support may provide better comfort for up to 4 weeks.  Avoid long distance travel for 4 weeks. ° °Return to normal activities after rotator cuff repair repair normally takes 6 months on average. If rehab goes very well, may be able to do most activities at 4 months, except overhead or contact sports. ° °4. Physical Therapy: Begins 3-4 days after surgery, and proceed 1 time per week for the first 6 weeks, then 1-2 times per week from weeks 6-20 post-op. ° °5. Medications:  °- You will be provided a prescription for narcotic pain medicine. After surgery, take 1-2 narcotic tablets every 4 hours if needed for severe pain.  °- A prescription for anti-nausea medication will be provided in case the narcotic medicine causes nausea - take 1 tablet every 6 hours only if nauseated.   °- Take tylenol 1000 mg (2 Extra Strength tablets or 3 regular strength) every 8 hours for pain.  May decrease or stop tylenol 5 days after surgery if you are having minimal pain. °- Take ASA 325mg/day x 2 weeks to help prevent DVTs/PEs (blood clots).  °- DO NOT take ANY nonsteroidal anti-inflammatory pain medications (Advil, Motrin, Ibuprofen, Aleve,  Naproxen, or Naprosyn). These medicines can inhibit healing of your shoulder repair.  ° ° °If you are taking prescription medication for anxiety, depression, insomnia, muscle spasm, chronic pain, or for attention deficit disorder, you are advised that you are at a higher risk of adverse effects with use of narcotics post-op, including narcotic addiction/dependence, depressed breathing, death. °If you use non-prescribed substances: alcohol, marijuana, cocaine, heroin, methamphetamines, etc., you are at a higher risk of adverse effects with use of narcotics post-op, including narcotic addiction/dependence, depressed breathing, death. °You are advised that taking > 50 morphine milligram equivalents (MME) of narcotic pain medication per day results in twice the risk of overdose or death. For your prescription provided: oxycodone 5 mg - taking more than 6 tablets per day would result in > 50 morphine milligram equivalents (MME) of narcotic pain medication. °Be advised that we will prescribe narcotics short-term, for acute post-operative pain only - 3 weeks for major operations such as shoulder repair/reconstruction surgeries.  ° ° ° °6. Post-Op Appointment: ° °Your first post-op appointment will be 10-14 days post-op. ° °7. Work or School: For most, but not all procedures, we advise staying out of work or school for at least 1 to 2 weeks in order to recover from the stress of surgery and to allow time for healing.  ° °If you need a work or school note this can be provided.  ° °8. Smoking: If you are a smoker, you need to refrain from   smoking in the postoperative period. The nicotine in cigarettes will inhibit healing of your shoulder repair and decrease the chance of successful repair. Similarly, nicotine containing products (gum, patches) should be avoided.  ° °Post-operative Brace: °Apply and remove the brace you received as you were instructed to at the time of fitting and as described in detail as the brace’s  instructions for use indicate.  Wear the brace for the period of time prescribed by your physician.  The brace can be cleaned with soap and water and allowed to air dry only.  Should the brace result in increased pain, decreased feeling (numbness/tingling), increased swelling or an overall worsening of your medical condition, please contact your doctor immediately.  If an emergency situation occurs as a result of wearing the brace after normal business hours, please dial 911 and seek immediate medical attention.  Let your doctor know if you have any further questions about the brace issued to you. °Refer to the shoulder sling instructions for use if you have any questions regarding the correct fit of your shoulder sling.  °[facility] Customer Care for Troubleshooting: [phone] ° °Video that illustrates how to properly use a shoulder sling: °"Instructions for Proper Use of an Orthopaedic Sling" °https://www.youtube.com/watch?v=AHZpn_Xo45w ° ° °Information for Discharge Teaching: °EXPAREL (bupivacaine liposome injectable suspension)  ° °Your surgeon or anesthesiologist gave you EXPAREL(bupivacaine) to help control your pain after surgery.  °· EXPAREL is a local anesthetic that provides pain relief by numbing the tissue around the surgical site. °· EXPAREL is designed to release pain medication over time and can control pain for up to 72 hours. °· Depending on how you respond to EXPAREL, you may require less pain medication during your recovery. ° °Possible side effects: °· Temporary loss of sensation or ability to move in the area where bupivacaine was injected. °· Nausea, vomiting, constipation °· Rarely, numbness and tingling in your mouth or lips, lightheadedness, or anxiety may occur. °· Call your doctor right away if you think you may be experiencing any of these sensations, or if you have other questions regarding possible side effects. ° °Follow all other discharge instructions given to you by your surgeon or  nurse. Eat a healthy diet and drink plenty of water or other fluids. ° °If you return to the hospital for any reason within 96 hours following the administration of EXPAREL, it is important for health care providers to know that you have received this anesthetic. A teal colored band has been placed on your arm with the date, time and amount of EXPAREL you have received in order to alert and inform your health care providers. Please leave this armband in place for the full 96 hours following administration, and then you may remove the band. ° ° ° °General Anesthesia, Adult, Care After °This sheet gives you information about how to care for yourself after your procedure. Your health care provider may also give you more specific instructions. If you have problems or questions, contact your health care provider. °What can I expect after the procedure? °After the procedure, the following side effects are common: °· Pain or discomfort at the IV site. °· Nausea. °· Vomiting. °· Sore throat. °· Trouble concentrating. °· Feeling cold or chills. °· Weak or tired. °· Sleepiness and fatigue. °· Soreness and body aches. These side effects can affect parts of the body that were not involved in surgery. °Follow these instructions at home: ° °For at least 24 hours after the procedure: °· Have a responsible adult   stay with you. It is important to have someone help care for you until you are awake and alert. °· Rest as needed. °· Do not: °? Participate in activities in which you could fall or become injured. °? Drive. °? Use heavy machinery. °? Drink alcohol. °? Take sleeping pills or medicines that cause drowsiness. °? Make important decisions or sign legal documents. °? Take care of children on your own. °Eating and drinking °· Follow any instructions from your health care provider about eating or drinking restrictions. °· When you feel hungry, start by eating small amounts of foods that are soft and easy to digest (bland), such as  toast. Gradually return to your regular diet. °· Drink enough fluid to keep your urine pale yellow. °· If you vomit, rehydrate by drinking water, juice, or clear broth. °General instructions °· If you have sleep apnea, surgery and certain medicines can increase your risk for breathing problems. Follow instructions from your health care provider about wearing your sleep device: °? Anytime you are sleeping, including during daytime naps. °? While taking prescription pain medicines, sleeping medicines, or medicines that make you drowsy. °· Return to your normal activities as told by your health care provider. Ask your health care provider what activities are safe for you. °· Take over-the-counter and prescription medicines only as told by your health care provider. °· If you smoke, do not smoke without supervision. °· Keep all follow-up visits as told by your health care provider. This is important. °Contact a health care provider if: °· You have nausea or vomiting that does not get better with medicine. °· You cannot eat or drink without vomiting. °· You have pain that does not get better with medicine. °· You are unable to pass urine. °· You develop a skin rash. °· You have a fever. °· You have redness around your IV site that gets worse. °Get help right away if: °· You have difficulty breathing. °· You have chest pain. °· You have blood in your urine or stool, or you vomit blood. °Summary °· After the procedure, it is common to have a sore throat or nausea. It is also common to feel tired. °· Have a responsible adult stay with you for the first 24 hours after general anesthesia. It is important to have someone help care for you until you are awake and alert. °· When you feel hungry, start by eating small amounts of foods that are soft and easy to digest (bland), such as toast. Gradually return to your regular diet. °· Drink enough fluid to keep your urine pale yellow. °· Return to your normal activities as told by  your health care provider. Ask your health care provider what activities are safe for you. °This information is not intended to replace advice given to you by your health care provider. Make sure you discuss any questions you have with your health care provider. °Document Released: 11/13/2000 Document Revised: 08/10/2017 Document Reviewed: 03/23/2017 °Elsevier Patient Education © 2020 [employer] ° °

## 2019-03-28 NOTE — Anesthesia Procedure Notes (Signed)
Procedure Name: LMA Insertion Date/Time: 03/28/2019 8:09 AM Performed by: Jeannene Patella, CRNA Pre-anesthesia Checklist: Patient identified, Emergency Drugs available, Suction available, Patient being monitored and Timeout performed Patient Re-evaluated:Patient Re-evaluated prior to induction Oxygen Delivery Method: Circle system utilized Preoxygenation: Pre-oxygenation with 100% oxygen Induction Type: IV induction Ventilation: Mask ventilation without difficulty LMA: LMA inserted LMA Size: 4.0 Number of attempts: 1

## 2019-03-28 NOTE — Anesthesia Procedure Notes (Signed)
Anesthesia Regional Block: Interscalene brachial plexus block   Pre-Anesthetic Checklist: ,, timeout performed, Correct Patient, Correct Site, Correct Laterality, Correct Procedure, Correct Position, site marked, Risks and benefits discussed,  Surgical consent,  Pre-op evaluation,  At surgeon's request and post-op pain management  Laterality: Right  Prep: chloraprep       Needles:  Injection technique: Single-shot  Needle Type: Stimiplex     Needle Length: 10cm  Needle Gauge: 21     Additional Needles:   Procedures:,,,, ultrasound used (permanent image in chart),,,,  Narrative:  Start time: 03/28/2019 7:07 AM End time: 03/28/2019 7:15 AM Injection made incrementally with aspirations every 5 mL.  Performed by: Personally  Anesthesiologist: Darrin Nipper, MD  Additional Notes: Functioning IV was confirmed and monitors applied. Ultrasound guidance: relevant anatomy identified, needle position confirmed, local anesthetic spread visualized around nerve(s)., vascular puncture avoided.  Image printed for medical record.  Negative aspiration and no paresthesias; incremental administration of local anesthetic. The patient tolerated the procedure well. Vitals signes recorded in RN notes.

## 2020-08-17 ENCOUNTER — Other Ambulatory Visit: Payer: Self-pay | Admitting: Surgery

## 2020-08-17 DIAGNOSIS — R1011 Right upper quadrant pain: Secondary | ICD-10-CM

## 2020-08-17 DIAGNOSIS — R109 Unspecified abdominal pain: Secondary | ICD-10-CM

## 2020-08-24 ENCOUNTER — Other Ambulatory Visit: Payer: Self-pay

## 2020-08-24 ENCOUNTER — Ambulatory Visit
Admission: RE | Admit: 2020-08-24 | Discharge: 2020-08-24 | Disposition: A | Discharge status: Home / Self Care | Payer: Medicare Other | Source: Ambulatory Visit | Attending: Surgery | Admitting: Surgery

## 2020-08-24 DIAGNOSIS — R109 Unspecified abdominal pain: Secondary | ICD-10-CM | POA: Insufficient documentation

## 2020-08-24 DIAGNOSIS — R0789 Other chest pain: Secondary | ICD-10-CM | POA: Insufficient documentation

## 2020-08-24 DIAGNOSIS — R1011 Right upper quadrant pain: Secondary | ICD-10-CM

## 2020-08-25 ENCOUNTER — Other Ambulatory Visit: Payer: Self-pay | Admitting: Cardiology

## 2020-08-25 DIAGNOSIS — R0789 Other chest pain: Secondary | ICD-10-CM

## 2020-09-03 ENCOUNTER — Other Ambulatory Visit: Payer: Self-pay

## 2020-09-03 ENCOUNTER — Encounter
Admission: RE | Admit: 2020-09-03 | Discharge: 2020-09-03 | Disposition: A | Discharge status: Home / Self Care | Payer: Medicare Other | Source: Ambulatory Visit | Attending: Cardiology | Admitting: Cardiology

## 2020-09-03 DIAGNOSIS — R0789 Other chest pain: Secondary | ICD-10-CM | POA: Diagnosis not present

## 2020-09-03 LAB — NM MYOCAR MULTI W/SPECT W/WALL MOTION / EF
Estimated workload: 9.2 METS
Exercise duration (min): 7 min
Exercise duration (sec): 29 s
LV dias vol: 167 mL (ref 62–150)
LV sys vol: 89 mL
MPHR: 153 {beats}/min
Peak HR: 133 {beats}/min
Percent HR: 86 %
Rest HR: 57 {beats}/min
SDS: 13
SRS: 8
SSS: 21
TID: 1.04

## 2020-09-03 MED ORDER — TECHNETIUM TC 99M TETROFOSMIN IV KIT
10.5400 | PACK | Freq: Once | INTRAVENOUS | Status: AC | PRN
  Administered 2020-09-03: 10.54 via INTRAVENOUS

## 2020-09-03 MED ORDER — TECHNETIUM TC 99M TETROFOSMIN IV KIT
29.1600 | PACK | Freq: Once | INTRAVENOUS | Status: AC | PRN
  Administered 2020-09-03: 29.16 via INTRAVENOUS

## 2020-09-09 ENCOUNTER — Other Ambulatory Visit
Admission: RE | Admit: 2020-09-09 | Discharge: 2020-09-09 | Disposition: A | Discharge status: Home / Self Care | Payer: Medicare Other | Source: Ambulatory Visit | Attending: Cardiology | Admitting: Cardiology

## 2020-09-09 ENCOUNTER — Other Ambulatory Visit: Payer: Self-pay

## 2020-09-09 DIAGNOSIS — Z01812 Encounter for preprocedural laboratory examination: Secondary | ICD-10-CM | POA: Diagnosis present

## 2020-09-09 DIAGNOSIS — Z20822 Contact with and (suspected) exposure to covid-19: Secondary | ICD-10-CM | POA: Insufficient documentation

## 2020-09-09 DIAGNOSIS — I25118 Atherosclerotic heart disease of native coronary artery with other forms of angina pectoris: Secondary | ICD-10-CM

## 2020-09-09 HISTORY — DX: Atherosclerotic heart disease of native coronary artery with other forms of angina pectoris: I25.118

## 2020-09-09 LAB — SARS CORONAVIRUS 2 (TAT 6-24 HRS): SARS Coronavirus 2: NEGATIVE

## 2020-09-12 ENCOUNTER — Other Ambulatory Visit: Payer: Self-pay | Admitting: Cardiology

## 2020-09-12 MED ORDER — SODIUM CHLORIDE 0.9% FLUSH: 3.0000 mL | Freq: Two times a day (BID) | INTRAVENOUS | Status: AC

## 2020-09-13 ENCOUNTER — Encounter
Admission: RE | Disposition: A | Discharge status: Home / Self Care | Payer: Self-pay | Source: Home / Self Care | Attending: Cardiology

## 2020-09-13 ENCOUNTER — Observation Stay
Admission: RE | Admit: 2020-09-13 | Discharge: 2020-09-14 | Disposition: A | Discharge status: Home / Self Care | Payer: Medicare Other | Attending: Cardiology | Admitting: Cardiology

## 2020-09-13 ENCOUNTER — Encounter: Payer: Self-pay | Admitting: Cardiology

## 2020-09-13 ENCOUNTER — Other Ambulatory Visit: Payer: Self-pay

## 2020-09-13 DIAGNOSIS — R9439 Abnormal result of other cardiovascular function study: Secondary | ICD-10-CM

## 2020-09-13 DIAGNOSIS — I25110 Atherosclerotic heart disease of native coronary artery with unstable angina pectoris: Principal | ICD-10-CM | POA: Insufficient documentation

## 2020-09-13 DIAGNOSIS — Z79899 Other long term (current) drug therapy: Secondary | ICD-10-CM | POA: Insufficient documentation

## 2020-09-13 DIAGNOSIS — I10 Essential (primary) hypertension: Secondary | ICD-10-CM | POA: Insufficient documentation

## 2020-09-13 DIAGNOSIS — Z955 Presence of coronary angioplasty implant and graft: Secondary | ICD-10-CM

## 2020-09-13 HISTORY — PX: LEFT HEART CATH AND CORONARY ANGIOGRAPHY: CATH118249

## 2020-09-13 HISTORY — PX: CORONARY STENT INTERVENTION: CATH118234

## 2020-09-13 HISTORY — DX: Essential (primary) hypertension: I10

## 2020-09-13 HISTORY — DX: Gastro-esophageal reflux disease without esophagitis: K21.9

## 2020-09-13 SURGERY — LEFT HEART CATH AND CORONARY ANGIOGRAPHY
Anesthesia: Moderate Sedation

## 2020-09-13 MED ORDER — SODIUM CHLORIDE 0.9 % IV SOLN: 250.0000 mL | INTRAVENOUS | Status: DC | PRN

## 2020-09-13 MED ORDER — HEPARIN SODIUM (PORCINE) 1000 UNIT/ML IJ SOLN
INTRAMUSCULAR | Status: AC
  Filled 2020-09-13: qty 1

## 2020-09-13 MED ORDER — HEPARIN SODIUM (PORCINE) 1000 UNIT/ML IJ SOLN
INTRAMUSCULAR | Status: DC | PRN
  Administered 2020-09-13: 5000 [IU] via INTRAVENOUS
  Administered 2020-09-13: 2500 [IU] via INTRAVENOUS
  Administered 2020-09-13: 4000 [IU] via INTRAVENOUS
  Administered 2020-09-13: 2500 [IU] via INTRAVENOUS

## 2020-09-13 MED ORDER — ACETAMINOPHEN 325 MG PO TABS: 650.0000 mg | ORAL_TABLET | ORAL | Status: DC | PRN

## 2020-09-13 MED ORDER — TICAGRELOR 90 MG PO TABS
ORAL_TABLET | ORAL | Status: DC | PRN
  Administered 2020-09-13: 180 mg via ORAL

## 2020-09-13 MED ORDER — SODIUM CHLORIDE 0.9% FLUSH
3.0000 mL | Freq: Two times a day (BID) | INTRAVENOUS | Status: DC
  Administered 2020-09-13 – 2020-09-14 (×2): 3 mL via INTRAVENOUS

## 2020-09-13 MED ORDER — HEPARIN (PORCINE) IN NACL 1000-0.9 UT/500ML-% IV SOLN
INTRAVENOUS | Status: DC | PRN
  Administered 2020-09-13: 500 mL

## 2020-09-13 MED ORDER — HYDRALAZINE HCL 20 MG/ML IJ SOLN: 10.0000 mg | INTRAMUSCULAR | Status: AC | PRN

## 2020-09-13 MED ORDER — VERAPAMIL HCL 2.5 MG/ML IV SOLN
INTRAVENOUS | Status: AC
  Filled 2020-09-13: qty 2

## 2020-09-13 MED ORDER — ASPIRIN 81 MG PO CHEW: 81.0000 mg | CHEWABLE_TABLET | ORAL | Status: AC

## 2020-09-13 MED ORDER — LISINOPRIL 5 MG PO TABS
5.0000 mg | ORAL_TABLET | Freq: Every day | ORAL | Status: DC
  Administered 2020-09-13 – 2020-09-14 (×2): 5 mg via ORAL
  Filled 2020-09-13: qty 0.5
  Filled 2020-09-13: qty 1

## 2020-09-13 MED ORDER — MIDAZOLAM HCL 2 MG/2ML IJ SOLN
INTRAMUSCULAR | Status: DC | PRN
  Administered 2020-09-13 (×2): 1 mg via INTRAVENOUS

## 2020-09-13 MED ORDER — TICAGRELOR 90 MG PO TABS
ORAL_TABLET | ORAL | Status: AC
  Filled 2020-09-13: qty 2

## 2020-09-13 MED ORDER — SODIUM CHLORIDE 0.9% FLUSH: 3.0000 mL | INTRAVENOUS | Status: DC | PRN

## 2020-09-13 MED ORDER — SODIUM CHLORIDE 0.9 % WEIGHT BASED INFUSION: 80.0000 mL/h | INTRAVENOUS | Status: DC

## 2020-09-13 MED ORDER — ASPIRIN 81 MG PO CHEW
CHEWABLE_TABLET | ORAL | Status: DC | PRN
  Administered 2020-09-13: 243 mg via ORAL

## 2020-09-13 MED ORDER — SODIUM CHLORIDE 0.9 % WEIGHT BASED INFUSION: 240.0000 mL/h | INTRAVENOUS | Status: AC

## 2020-09-13 MED ORDER — FENTANYL CITRATE (PF) 100 MCG/2ML IJ SOLN
INTRAMUSCULAR | Status: AC
  Filled 2020-09-13: qty 2

## 2020-09-13 MED ORDER — LABETALOL HCL 5 MG/ML IV SOLN: 10.0000 mg | INTRAVENOUS | Status: AC | PRN

## 2020-09-13 MED ORDER — ASPIRIN 81 MG PO CHEW
CHEWABLE_TABLET | ORAL | Status: AC
  Filled 2020-09-13: qty 3

## 2020-09-13 MED ORDER — VERAPAMIL HCL 2.5 MG/ML IV SOLN
INTRAVENOUS | Status: DC | PRN
  Administered 2020-09-13: 2.5 mg via INTRA_ARTERIAL

## 2020-09-13 MED ORDER — ATORVASTATIN CALCIUM 80 MG PO TABS
80.0000 mg | ORAL_TABLET | Freq: Every day | ORAL | Status: DC
  Administered 2020-09-13 – 2020-09-14 (×2): 80 mg via ORAL
  Filled 2020-09-13 (×4): qty 1

## 2020-09-13 MED ORDER — HEPARIN (PORCINE) IN NACL 1000-0.9 UT/500ML-% IV SOLN
INTRAVENOUS | Status: AC
  Filled 2020-09-13: qty 1000

## 2020-09-13 MED ORDER — SODIUM CHLORIDE 0.9 % WEIGHT BASED INFUSION: 1.0000 mL/kg/h | INTRAVENOUS | Status: AC

## 2020-09-13 MED ORDER — TICAGRELOR 90 MG PO TABS
90.0000 mg | ORAL_TABLET | Freq: Two times a day (BID) | ORAL | Status: DC
  Administered 2020-09-13 – 2020-09-14 (×2): 90 mg via ORAL
  Filled 2020-09-13 (×2): qty 1

## 2020-09-13 MED ORDER — METOPROLOL SUCCINATE ER 50 MG PO TB24
25.0000 mg | ORAL_TABLET | Freq: Every day | ORAL | Status: DC
  Filled 2020-09-13: qty 0.5

## 2020-09-13 MED ORDER — ASPIRIN 81 MG PO CHEW
81.0000 mg | CHEWABLE_TABLET | Freq: Every day | ORAL | Status: DC
  Administered 2020-09-14: 81 mg via ORAL
  Filled 2020-09-13: qty 1

## 2020-09-13 MED ORDER — IOHEXOL 300 MG/ML  SOLN
INTRAMUSCULAR | Status: DC | PRN
  Administered 2020-09-13: 200 mL

## 2020-09-13 MED ORDER — FENTANYL CITRATE (PF) 100 MCG/2ML IJ SOLN
INTRAMUSCULAR | Status: DC | PRN
  Administered 2020-09-13: 25 ug via INTRAVENOUS
  Administered 2020-09-13: 50 ug via INTRAVENOUS

## 2020-09-13 MED ORDER — MIDAZOLAM HCL 2 MG/2ML IJ SOLN
INTRAMUSCULAR | Status: AC
  Filled 2020-09-13: qty 2

## 2020-09-13 MED ORDER — ASPIRIN 81 MG PO CHEW
CHEWABLE_TABLET | ORAL | Status: AC
  Administered 2020-09-13: 81 mg via ORAL
  Filled 2020-09-13: qty 1

## 2020-09-13 MED ORDER — ONDANSETRON HCL 4 MG/2ML IJ SOLN: 4.0000 mg | Freq: Four times a day (QID) | INTRAMUSCULAR | Status: DC | PRN

## 2020-09-13 SURGICAL SUPPLY — 16 items
BALLN TREK RX 2.5X15 (BALLOONS) ×3
BALLN ~~LOC~~ TREK RX 3.0X8 (BALLOONS) ×3
BALLOON TREK RX 2.5X15 (BALLOONS) ×2 IMPLANT
BALLOON ~~LOC~~ TREK RX 3.0X8 (BALLOONS) ×2 IMPLANT
CATH INFINITI 5 FR JL3.5 (CATHETERS) ×3 IMPLANT
CATH INFINITI JR4 5F (CATHETERS) ×3 IMPLANT
CATH VISTA GUIDE 6FR XB3.5 (CATHETERS) ×3 IMPLANT
DEVICE RAD TR BAND REGULAR (VASCULAR PRODUCTS) ×3 IMPLANT
GLIDESHEATH SLEND SS 6F .021 (SHEATH) ×3 IMPLANT
GUIDEWIRE INQWIRE 1.5J.035X260 (WIRE) ×2 IMPLANT
INQWIRE 1.5J .035X260CM (WIRE) ×3
KIT ENCORE 26 ADVANTAGE (KITS) ×3 IMPLANT
KIT MANI 3VAL PERCEP (MISCELLANEOUS) ×3 IMPLANT
PACK CARDIAC CATH (CUSTOM PROCEDURE TRAY) ×3 IMPLANT
STENT RESOLUTE ONYX 2.75X15 (Permanent Stent) ×3 IMPLANT
WIRE G HI TQ BMW 190 (WIRE) ×3 IMPLANT

## 2020-09-13 NOTE — Progress Notes (Signed)
Patient admitted to 2A from cath lab. Oriented to room.  Patient s/p R radial cath.  Site with dressing dry and intact.  No complaints at this time.

## 2020-09-13 NOTE — Plan of Care (Signed)
Right radial site without hematoma or active bleed.

## 2020-09-13 NOTE — H&P (Signed)
Chief Complaint: Chief Complaint  Patient presents with  . Follow-up  stress test  Date of Service: 09/08/2020 Date of Birth: 06/21/1951 PCP: Jaclyn Shaggy, MD  History of Present Illness: Kenneth Pena is a 70 y.o.male patient who presents for follow-up visit. Underwent a functional study showing borderline inferior ischemia. He continues to have exertional symptoms. Patient has no prior cardiac history. He states his cholesterol is in good control. He is never smoked. He exercises frequently in the gym and is treated for hypertension. He has been having right upper chest pain for some time. It can occur at rest or with exertion. It sometimes will occur while he is walking on a treadmill but he is also able to be fairly vigorous on the treadmill without difficulty. His electrocardiogram at previous visit revealed sinus bradycardia at a rate of 45 with a PR interval of 176 ms, QRS duration 114 ms with a QTC of 358 ms and a QRS axis of 52 degrees. There are no ischemic changes. He underwent a gallbladder ultrasound today revealing a mobile stone but no cholecystitis. His symptoms sometimes worsen while eating. He is on pantoprazole.  Past Medical and Surgical History  Past Medical History Past Medical History:  Diagnosis Date  . Chicken pox  . Hx of adenomatous polyp of colon 02/14/2019  . Kidney stones  . Shingles   Past Surgical History He has a past surgical history that includes Colonoscopy (12/24/1995); Colonoscopy (12/18/2003); egd (12/24/1995); Colonoscopy (12/31/2013); WISDOM TEETH; Colonoscopy (03/04/2019); and Right mini-open rotator cuff repair, right open biceps tenodesis, right arthroscopic distal clavicle excision, right arthroscopic extensive debridment of shoulder (glenohumeral and subacromial spaces) right arthroscopic subacromial decompre (Right, 03/28/2019).   Medications and Allergies  Current Medications  Current Outpatient Medications  Medication Sig Dispense Refill  .  lisinopriL (ZESTRIL) 2.5 MG tablet  . pantoprazole (PROTONIX) 40 MG DR tablet  . tadalafiL (CIALIS) 5 MG tablet Take 5 mg by mouth once daily   No current facility-administered medications for this visit.   Allergies: Patient has no known allergies.  Social and Family History  Social History reports that he has never smoked. He has never used smokeless tobacco. He reports current alcohol use. He reports that he does not use drugs.  Family History Family History  Problem Relation Age of Onset  . Heart disease Mother  . No Known Problems Father  . No Known Problems Sister  . No Known Problems Brother   Review of Systems  Review of Systems  Constitutional: Negative for chills, diaphoresis, fever, malaise/fatigue and weight loss.  HENT: Negative for congestion, ear discharge, hearing loss and tinnitus.  Eyes: Negative for blurred vision.  Respiratory: Negative for cough, hemoptysis, sputum production, shortness of breath and wheezing.  Cardiovascular: Positive for chest pain. Negative for palpitations, orthopnea, claudication, leg swelling and PND.  Gastrointestinal: Negative for abdominal pain, blood in stool, constipation, diarrhea, heartburn, melena, nausea and vomiting.  Genitourinary: Negative for dysuria, frequency, hematuria and urgency.  Musculoskeletal: Negative for back pain, falls, joint pain and myalgias.  Skin: Negative for itching and rash.  Neurological: Negative for dizziness, tingling, focal weakness, loss of consciousness, weakness and headaches.  Endo/Heme/Allergies: Negative for polydipsia. Does not bruise/bleed easily.  Psychiatric/Behavioral: Negative for depression, memory loss and substance abuse. The patient is not nervous/anxious.   Physical Examination   Vitals:BP (!) 140/84  Pulse 58  Resp 16  Ht 182.9 cm (6')  Wt 82.1 kg (181 lb)  SpO2 99%  BMI 24.55 kg/m  Ht:182.9 cm (6') Wt:82.1 kg (181 lb) LMB:EMLJ surface area is 2.04 meters squared. Body  mass index is 24.55 kg/m.  Wt Readings from Last 3 Encounters:  09/08/20 82.1 kg (181 lb)  08/24/20 83 kg (183 lb)  08/17/20 81.2 kg (179 lb)   BP Readings from Last 3 Encounters:  09/08/20 (!) 140/84  08/24/20 124/80  08/17/20 175/82   General appearance appears in no acute distress  Head Mouth and Eye exam Normocephalic, without obvious abnormality, atraumatic Dentition is good Eyes appear anicteric       LUNGS Breath Sounds: Normal Percussion: Normal  CARDIOVASCULAR JVP CV wave: no HJR: no Elevation at 90 degrees: None Carotid Pulse: normal pulsation bilaterally Bruit: None Apex: apical impulse normal  Auscultation Rhythm: sinus bradycardia, rate=58 S1: normal S2: normal Clicks: no Rub: no Murmurs: no murmurs  Gallop: None  EXTREMITIES Clubbing: no Edema: trace to 1+ bilateral pedal edema Pulses: peripheral pulses symmetrical Femoral Bruits: no Amputation: no SKIN Rash: no Cyanosis: no Embolic phemonenon: no Bruising: no NEURO Alert and Oriented to person, place and time: yes Non focal: yes  PSYCH: Pt appears to have normal affect  LABS REVIEWED Last 3 CBC results: Lab Results  Component Value Date  WBC 3.7 (L) 08/17/2020   Lab Results  Component Value Date  HGB 14.8 08/17/2020   Lab Results  Component Value Date  HCT 42.4 08/17/2020   Lab Results  Component Value Date  PLT 190 08/17/2020   Lab Results  Component Value Date  CREATININE 0.9 08/17/2020  BUN 13 08/17/2020  NA 141 08/17/2020  K 4.1 08/17/2020  CL 106 08/17/2020  CO2 27.8 08/17/2020   No results found for: HGBA1C  Lab Results  Component Value Date  HDL 58 09/29/2005   Lab Results  Component Value Date  LDLCALC 99 09/29/2005   Lab Results  Component Value Date  TRIG 47 09/29/2005   Lab Results  Component Value Date  ALT 23 08/17/2020  AST 31 08/17/2020  ALKPHOS 55 08/17/2020   No results found for: TSH  Diagnostic Studies  Reviewed:  EKG EKG demonstrated nonspecific ST and T waves changes, sinus bradycardia.  Assessment and Plan   70 y.o. male with  ICD-10-CM  1. Chest tightness-patient has both typical atypical features. Functional study showed borderline inferior ischemia. Patient has persistent exertional symptoms. We will need to evaluate left heart cath due to continued exertional chest pain and abnormal functional study. Risk and benefits of left heart cath were explained the patient he agrees to proceed. Plan left heart catheter for the recommendation of this complete. R07.89  2. Hypertension-continue with current regimen.    No follow-ups on file.  These notes generated with voice recognition software. I apologize for typographical errors.  Denton Ar, MD   Pt seen and examined. No change from above.

## 2020-09-13 NOTE — Progress Notes (Signed)
Pt TR band removed with no bleeding noted. Bandage now in place. Floor notified of pt impending arrival. Right wrist IV d/c due to bandage placement.

## 2020-09-14 ENCOUNTER — Encounter: Payer: Self-pay | Admitting: Internal Medicine

## 2020-09-14 DIAGNOSIS — I25110 Atherosclerotic heart disease of native coronary artery with unstable angina pectoris: Secondary | ICD-10-CM | POA: Diagnosis not present

## 2020-09-14 LAB — POCT ACTIVATED CLOTTING TIME
Activated Clotting Time: 190 seconds
Activated Clotting Time: 267 seconds
Activated Clotting Time: 273 seconds

## 2020-09-14 MED ORDER — TICAGRELOR 90 MG PO TABS: 90.0000 mg | ORAL_TABLET | Freq: Two times a day (BID) | ORAL | 0 refills | Status: DC

## 2020-09-14 MED ORDER — ATORVASTATIN CALCIUM 80 MG PO TABS: 80.0000 mg | ORAL_TABLET | Freq: Every day | ORAL | 0 refills | Status: AC

## 2020-09-14 NOTE — Progress Notes (Signed)
[facility] Cardiology    SUBJECTIVE: Mr. Kenneth Pena is a 70 year old male with a past medical history significant for hypertension and GERD who presented to [facility] for an elective left heart catheterization following an abnormal ETT Myoview.  He underwent an ETT Myoview on 09/03/20 which revealed basal inferoseptal, mid inferoseptal, and apical septal ischemia with moderately reduced LV systolic function, an EF of 30-44%.  He underwent left heart catheterization via right radial approach with Dr. Juliann Pares that revealed a proximal LAD lesion, 95% stenosed, that was successfully treated with a Resolute Onyx DES.    09/14/20:  Mr. Ohalloran is lying in bed, in no acute distress.  Denies any chest pain or chest pressure since the procedure.  He also denies shortness of breath, lower extremity swelling, orthopnea, or PND.  Despite relative bradycardia, he is asymptomatic and denies dizziness, lightheadedness, or syncopal/presyncopal episodes.     Vitals:   09/13/20 2039 09/14/20 0008 09/14/20 0100 09/14/20 0506  BP: (!) 146/96 107/66  118/81  Pulse: (!) 49 (!) 53  (!) 51  Resp: 17 18  18   Temp: 97.8 F (36.6 C) 98.2 F (36.8 C)  98.2 F (36.8 C)  TempSrc: Oral Oral  Oral  SpO2: 94% 96%  97%  Weight:   83.5 kg   Height:         Intake/Output Summary (Last 24 hours) at 09/14/2020 09/16/2020 Last data filed at 09/13/2020 2311 Gross per 24 hour  Intake 360 ml  Output 800 ml  Net -440 ml      PHYSICAL EXAM  General: Well developed, well nourished, in no acute distress HEENT:  Normocephalic and atramatic Neck:  No JVD.  Lungs: Clear bilaterally to auscultation and percussion. Heart: Bradycardic, regular rhythm. Normal S1 and S2 without gallops or murmurs.  Abdomen: Bowel sounds are positive, abdomen soft and non-tender  Msk:  Back normal. Normal strength and tone for age. Extremities: No clubbing, cyanosis or edema.  Right radial access site clean, dry, intact with no evidence of ecchymosis, erythema,  hematoma, or edema.  Neuro: Alert and oriented X 3. Psych:  Good affect, responds appropriately   LABS: Basic Metabolic Panel: No results for input(s): NA, K, CL, CO2, GLUCOSE, BUN, CREATININE, CALCIUM, MG, PHOS in the last 72 hours. Liver Function Tests: No results for input(s): AST, ALT, ALKPHOS, BILITOT, PROT, ALBUMIN in the last 72 hours. No results for input(s): LIPASE, AMYLASE in the last 72 hours. CBC: No results for input(s): WBC, NEUTROABS, HGB, HCT, MCV, PLT in the last 72 hours. Cardiac Enzymes: No results for input(s): CKTOTAL, CKMB, CKMBINDEX, TROPONINI in the last 72 hours. BNP: Invalid input(s): POCBNP D-Dimer: No results for input(s): DDIMER in the last 72 hours. Hemoglobin A1C: No results for input(s): HGBA1C in the last 72 hours. Fasting Lipid Panel: No results for input(s): CHOL, HDL, LDLCALC, TRIG, CHOLHDL, LDLDIRECT in the last 72 hours. Thyroid Function Tests: No results for input(s): TSH, T4TOTAL, T3FREE, THYROIDAB in the last 72 hours.  Invalid input(s): FREET3 Anemia Panel: No results for input(s): VITAMINB12, FOLATE, FERRITIN, TIBC, IRON, RETICCTPCT in the last 72 hours.  No results found.   TELEMETRY: Sinus bradycardia, rate in the upper 40s   ASSESSMENT AND PLAN:  Active Problems:   S/P drug eluting coronary stent placement    1.  Coronary artery disease s/p PCI to proximal LAD  -S/p PCI to the proximal LAD   -Will discharge on aspirin 81mg  daily, Brilinta 90mg  BID, atorvastatin 80mg  daily, and home dose of  lisinopril 5mg  daily   -Beta blocker deferred due to bradycardia   -Cardiac rehab ordered  -Will set up outpatient cardiology follow up with Dr. within 2 weeks of discharge  -Recommend obtaining echocardiogram in an outpatient setting to optimize medical management   2.  Hypertension   -Continue lisinopril 5mg  daily   The history, physical exam findings, and plan of care were all discussed with Dr. Harold Hedge, and all  decision making was made in collaboration.    PA-C 09/14/2020 7:38 AM

## 2020-09-14 NOTE — Discharge Summary (Signed)
Physician Discharge Summary  Patient ID: Kenneth Pena MRN: [id number] DOB/AGE: 02/17/1951 70 y.o.  Admit date: 09/13/2020 Discharge date: 09/14/2020  Admission Diagnoses: Abnormal stress test  Discharge Diagnoses:  Active Problems:   S/P drug eluting coronary stent placement   Discharged Condition: good  Hospital Course: Kenneth Pena presented to [facility] on 09/13/20 for an elective LHC following an abnormal ETT Myoview.  He underwent a LHC with Dr. Juliann Pares via right radial approach on 09/13/20 which revealed a proximal LAD lesion, 95% stenosed, that was successfully treated with a Resolute Onyx DES.  There were no post-procedure complications.    On exam, Kenneth Pena is lying in bed, in no acute distress.  He denies any chest pain or chest pressure since the procedure.  He denies any pain, bleeding, or drainage from right radial access site. He also denies shortness of breath, lower extremity swelling, orthopnea, or PND.  Despite relative bradycardia, he is asymptomatic and denies dizziness, lightheadedness, or syncopal/presyncopal episodes.    Consults: None   Discharge Exam: Blood pressure (!) 126/96, pulse (!) 48, temperature 97.9 F (36.6 C), temperature source Oral, resp. rate 18, height 6' (1.829 m), weight 83.5 kg, SpO2 99 %. General appearance: alert, cooperative and appears stated age Head: Normocephalic, without obvious abnormality, atraumatic Neck: no adenopathy, no carotid bruit, no JVD, supple, symmetrical, trachea midline and thyroid not enlarged, symmetric, no tenderness/mass/nodules Resp: clear to auscultation bilaterally Chest wall: no tenderness Cardio: Bradycardic, regular rhythm with no murmurs, gallops, or rubs Extremities: extremities normal, atraumatic, no cyanosis or edema Pulses: 2+ and symmetric  Disposition: Home  Discharge Instructions    AMB Referral to Cardiac Rehabilitation - Phase II   Complete by: As directed    Diagnosis: Coronary Stents   After  initial evaluation and assessments completed: Virtual Based Care may be provided alone or in conjunction with Phase 2 Cardiac Rehab based on patient barriers.: Yes     Allergies as of 09/14/2020   No Known Allergies     Medication List    STOP taking these medications   ondansetron 4 MG disintegrating tablet Commonly known as: Zofran ODT     TAKE these medications   aspirin EC 81 MG tablet Take 81 mg by mouth daily. Swallow whole.   atorvastatin 80 MG tablet Commonly known as: LIPITOR Take 1 tablet (80 mg total) by mouth daily.   lisinopril 2.5 MG tablet Commonly known as: ZESTRIL Take 5 mg by mouth daily.   pantoprazole 40 MG tablet Commonly known as: PROTONIX Take 40 mg by mouth daily.   tadalafil 5 MG tablet Commonly known as: CIALIS Take 5 mg by mouth daily.   ticagrelor 90 MG Tabs tablet Commonly known as: BRILINTA Take 1 tablet (90 mg total) by mouth 2 (two) times daily.       Follow-up Information    [name], MD Follow up in 2 week(s).   Specialty: Cardiology Contact information: [address] [place] [address] [phone]              1.  Coronary artery disease s/p PCI to proximal LAD             -S/p PCI to the proximal LAD              -Will discharge on aspirin 81mg  daily, Brilinta 90mg  BID, atorvastatin 80mg  daily, and home dose of lisinopril 5mg  daily              -Beta blocker  deferred due to bradycardia              -Cardiac rehab ordered             -Will set up outpatient cardiology follow up with Dr. Harold Hedge within 2 weeks of discharge              -Recommend obtaining an echocardiogram in an outpatient setting to reassess LV function and optimize medical management   -Will obtain a lipid panel 3 months following discharge for an optimal LDL goal less than 70   2.  Hypertension              -Continue lisinopril 5mg  daily    The history, physical exam findings, and plan of care were all discussed with Dr.  , and all decision making was made in collaboration.    Signed: Harold Hedge 09/14/2020, 8:06 AM

## 2020-09-27 DIAGNOSIS — I25118 Atherosclerotic heart disease of native coronary artery with other forms of angina pectoris: Secondary | ICD-10-CM | POA: Insufficient documentation

## 2021-01-19 ENCOUNTER — Other Ambulatory Visit: Payer: Self-pay

## 2021-01-19 ENCOUNTER — Ambulatory Visit: Payer: Medicare Other

## 2021-01-19 ENCOUNTER — Ambulatory Visit (INDEPENDENT_AMBULATORY_CARE_PROVIDER_SITE_OTHER): Payer: Medicare Other

## 2021-01-19 ENCOUNTER — Ambulatory Visit (INDEPENDENT_AMBULATORY_CARE_PROVIDER_SITE_OTHER): Payer: Medicare Other | Admitting: Podiatry

## 2021-01-19 DIAGNOSIS — M21621 Bunionette of right foot: Secondary | ICD-10-CM

## 2021-01-19 DIAGNOSIS — M7752 Other enthesopathy of left foot: Secondary | ICD-10-CM | POA: Diagnosis not present

## 2021-01-19 DIAGNOSIS — M21622 Bunionette of left foot: Secondary | ICD-10-CM

## 2021-01-19 DIAGNOSIS — M7751 Other enthesopathy of right foot: Secondary | ICD-10-CM

## 2021-01-23 ENCOUNTER — Encounter: Payer: Self-pay | Admitting: Podiatry

## 2021-01-23 NOTE — Progress Notes (Signed)
  Subjective:  Patient ID: Kenneth Pena, male    DOB: 1950-10-09,  MRN: [id number]  Chief Complaint  Patient presents with  . Bunions    NP/ TAILORS BUNION B/L    70 y.o. male presents with the above complaint. History confirmed with patient.  Has worsened and is becoming painful.    He has tried silicone pads over the tailor's bunions but they were causing blisters in between the fourth and fifth toes  Objective:  Physical Exam: warm, good capillary refill, no trophic changes or ulcerative lesions, normal DP and PT pulses and normal sensory exam.  Bilaterally he has pes cavus foot type with tailor's bunions and erythema over the fifth MTPJ   Radiographs: X-ray of both feet: no fracture, dislocation, swelling or degenerative changes noted, pes cavus and tailor's bunion noted Assessment:   1. Tailor's bunion of both feet   2. Bursitis of both feet      Plan:  Patient was evaluated and treated and all questions answered.  Gust etiology and treatment options of tailor's bunions in detail.  Discussed surgical nonsurgical treatment.  Discussed offloading with gel pads which has so far not helped but I dispensed a different type today and he will try these.  We also discussed surgical correction if this is not successful.  He would prefer to avoid surgery.  We also discussed injection for the bursitis which he had signs of today.  I recommended this and following sterile prep with alcohol 5 mg of Kenalog and 2 mg of dexamethasone phosphate were injected into the lateral plantar fifth MTPJ bilaterally.  He tolerated well.  Return as needed if the pads are not helpful and he is interested in surgical correction  No follow-ups on file.

## 2021-02-08 ENCOUNTER — Telehealth: Payer: Self-pay | Admitting: Podiatry

## 2021-02-08 NOTE — Telephone Encounter (Signed)
Pt left message yesterday wanting to discuss orthotics.  I returned the call and left message for pt to call me back.

## 2021-05-11 ENCOUNTER — Ambulatory Visit: Payer: Medicare Other

## 2021-06-16 ENCOUNTER — Other Ambulatory Visit: Payer: Self-pay

## 2021-06-16 ENCOUNTER — Ambulatory Visit (INDEPENDENT_AMBULATORY_CARE_PROVIDER_SITE_OTHER): Payer: Medicare Other | Admitting: Podiatry

## 2021-06-16 DIAGNOSIS — M21621 Bunionette of right foot: Secondary | ICD-10-CM

## 2021-06-16 DIAGNOSIS — M21622 Bunionette of left foot: Secondary | ICD-10-CM | POA: Diagnosis not present

## 2021-06-16 NOTE — Progress Notes (Signed)
  Subjective:  Patient ID: Kenneth Pena, male    DOB: [birth year],  MRN: [id number]  Chief Complaint  Patient presents with   Bunions       left foot not better     70 y.o. male presents with the above complaint. History confirmed with patient.  Both feet are still painful.  The injections did not help much at all.  He is interested in what his surgical options are at this point  Objective:  Physical Exam: warm, good capillary refill, no trophic changes or ulcerative lesions, normal DP and PT pulses and normal sensory exam.  Bilaterally he has pes cavus foot type with tailor's bunions and Tenderness over the fifth MTPJ   Radiographs: X-ray of both feet: no fracture, dislocation, swelling or degenerative changes noted, pes cavus and tailor's bunion noted Assessment:   1. Tailor's bunion of both feet      Plan:  Patient was evaluated and treated and all questions answered.  We again discussed the etiology and treatment options of tailor's bunion deformity.  He did not respond well to injection therapy.  At this point he has not improved despite nonsurgical care.  I recommended surgical correction.  We discussed an osteotomy of the fifth metatarsal bilaterally through minimally invasive approach with likely percutaneous pinning.  We discussed the risk benefits and potential complications of this including not limited to pain, swelling, infection, scar, numbness which may be temporary or permanent, chronic pain, stiffness, nerve pain or damage, wound healing problems, bone healing problems including delayed or non-union.  He understands and wishes to proceed.  Informed consent was signed and reviewed.  Surgery scheduled for tomorrow.   Surgical plan:  Procedure: -Bilateral fifth metatarsal osteotomy  Location: -[facility]  Anesthesia plan: -IV sedation with local anesthesia  Postoperative pain plan: - Tylenol 1000 mg every 6 hours, ibuprofen 600 mg every 6 hours, gabapentin 300 mg  every 8 hours x5 days, oxycodone 5 mg 1-2 tabs every 6 hours only as needed  DVT prophylaxis: -None required  WB Restrictions / DME needs: -WBAT in surgical shoes these were dispensed today    No follow-ups on file.

## 2021-06-17 ENCOUNTER — Other Ambulatory Visit: Payer: Self-pay | Admitting: Podiatry

## 2021-06-17 DIAGNOSIS — M2011 Hallux valgus (acquired), right foot: Secondary | ICD-10-CM | POA: Diagnosis not present

## 2021-06-17 DIAGNOSIS — M2012 Hallux valgus (acquired), left foot: Secondary | ICD-10-CM | POA: Diagnosis not present

## 2021-06-17 IMAGING — MR MRI OF THE RIGHT SHOULDER WITHOUT CONTRAST
5 series · 36 of 40 positions shown · non-contrast
Comparison: Radiographs 02/22/2019

CLINICAL DATA: Acute right shoulder pain. History of dislocation 3
weeks ago.

EXAM:
MRI OF THE RIGHT SHOULDER WITHOUT CONTRAST
TECHNIQUE: Multiplanar, multisequence MR imaging of the shoulder was performed.
No intravenous contrast was administered.

[Series 3: PD fat-sat · axial · right · 4.0mm · 0.55mm/px · z∈[-68,+62]mm · 8 of 28 slices shown (1 of 2)]
[im 1/28]
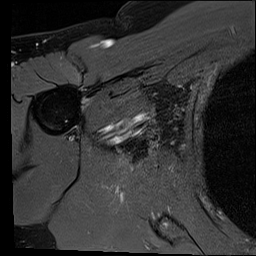
[im 4/28]
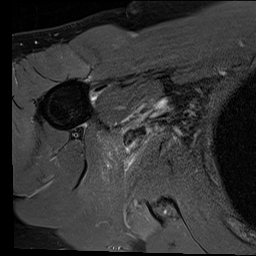
[im 8/28]
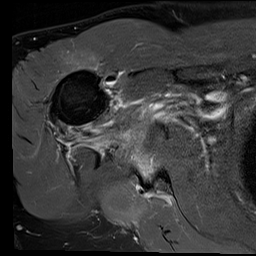
[im 12/28]
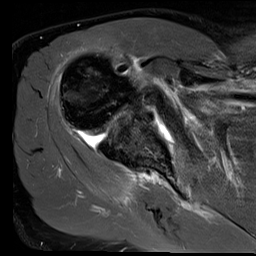
[im 16/28]
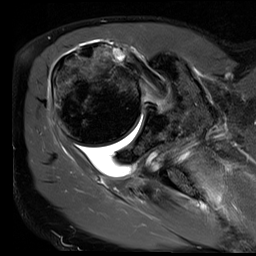
[im 20/28]
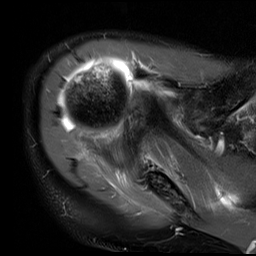
[im 24/28]
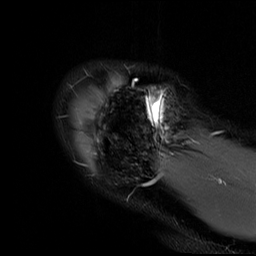
[im 28/28]
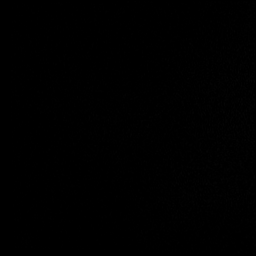

[Series 4: PD fat-sat · oblique · right · 4.0mm · 0.44mm/px · 8 of 30 slices shown (2 of 2)]
[im 1/30]
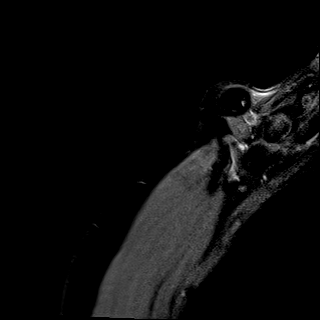
[im 5/30]
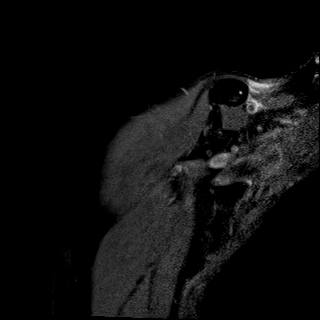
[im 9/30]
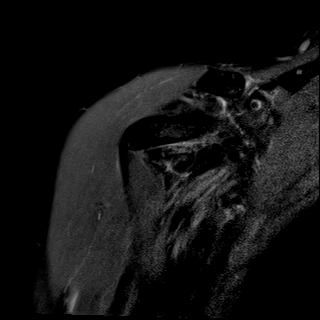
[im 13/30]
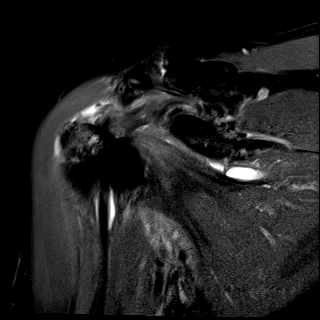
[im 17/30]
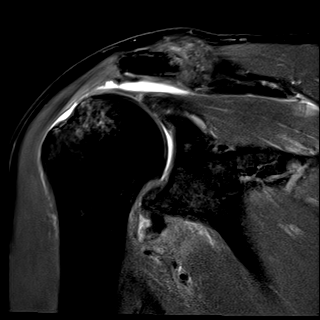
[im 21/30]
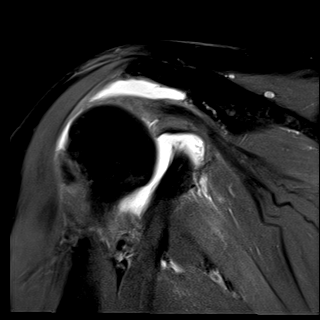
[im 25/30]
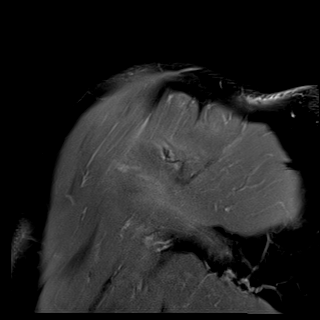
[im 30/30]
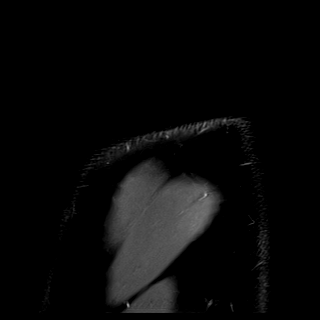

[Series 5: T2 fat-sat · oblique · right · 4.0mm · 0.44mm/px · 8 of 30 slices shown (1 of 2)]
[im 1/30]
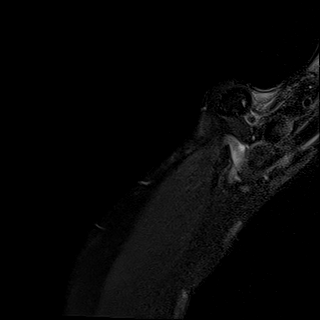
[im 5/30]
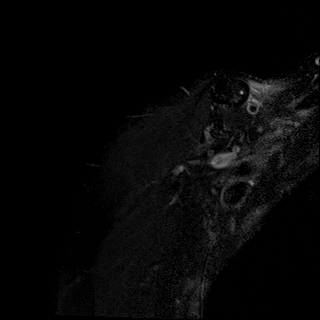
[im 9/30]
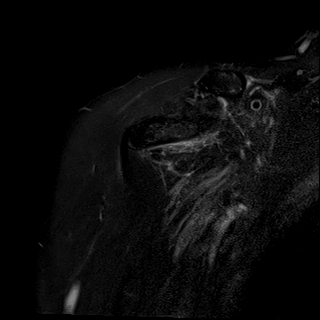
[im 13/30]
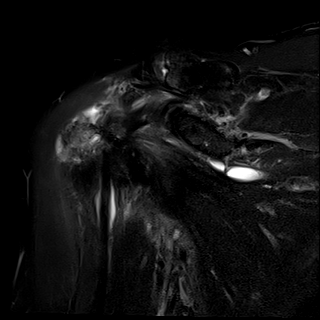
[im 17/30]
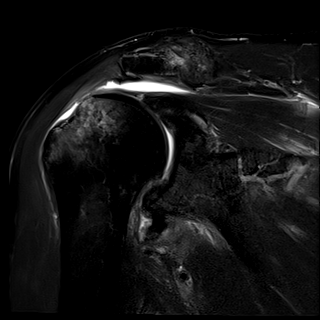
[im 21/30]
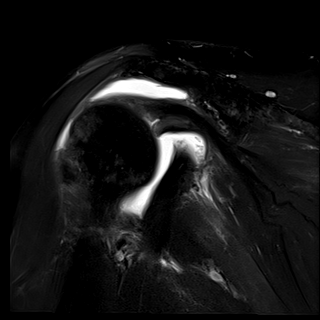
[im 25/30]
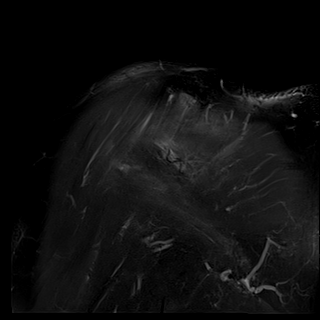
[im 30/30]
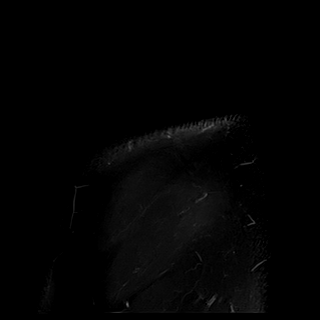

[Series 6: T2 fat-sat · oblique · right · 4.0mm · 0.27mm/px · 8 of 27 slices shown (2 of 2)]
[im 1/27]
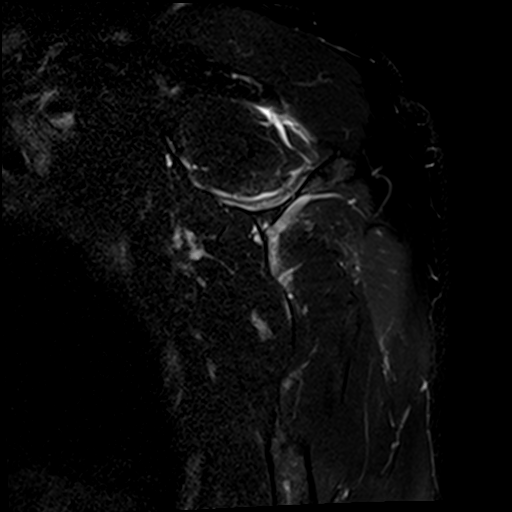
[im 4/27]
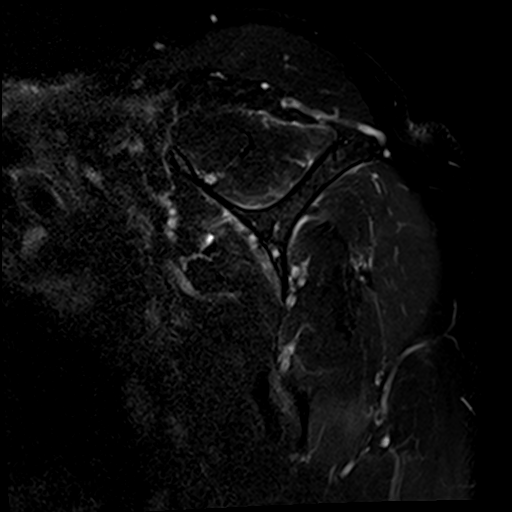
[im 8/27]
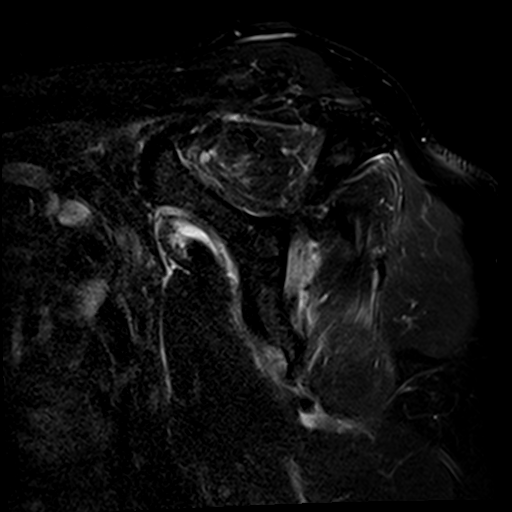
[im 12/27]
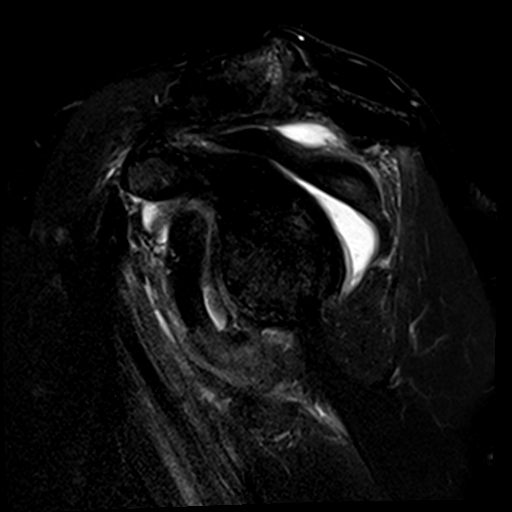
[im 15/27]
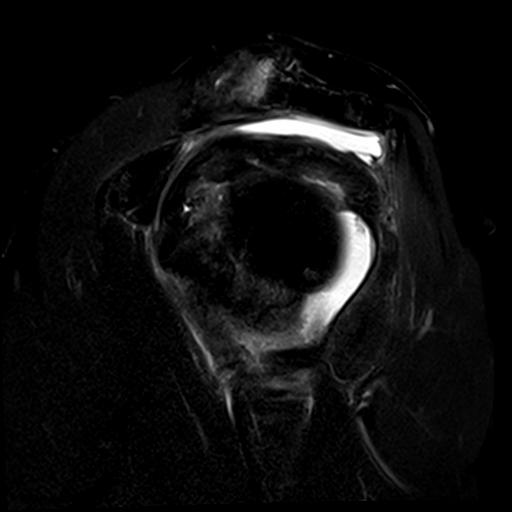
[im 19/27]
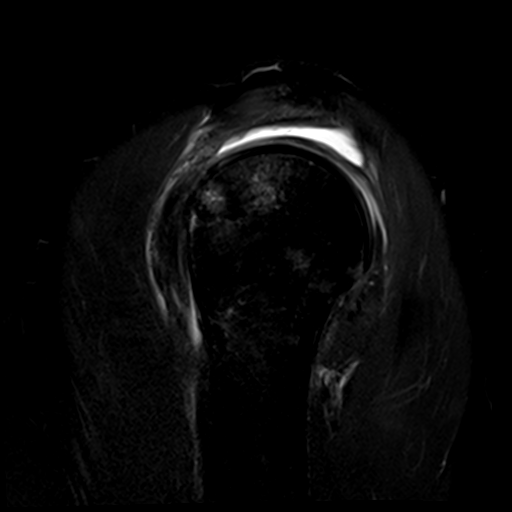
[im 23/27]
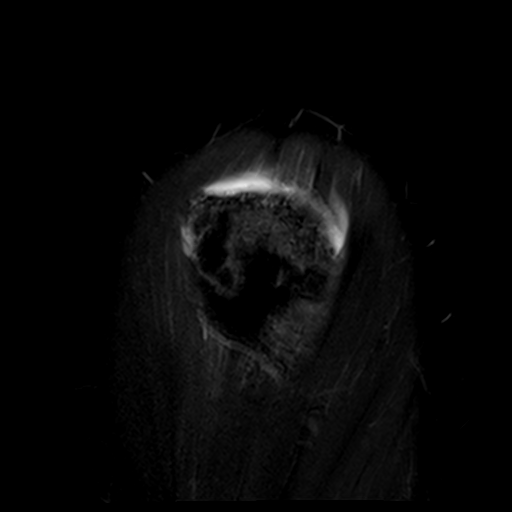
[im 27/27]
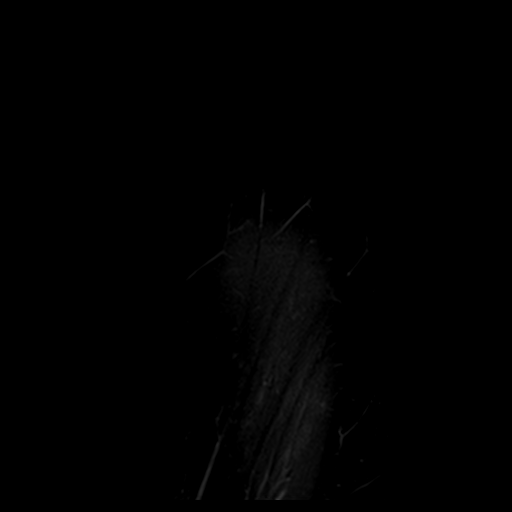

[Series 7: T1 · oblique · right · 4.0mm · 0.44mm/px · 4 of 27 slices shown]
[im 1/27]
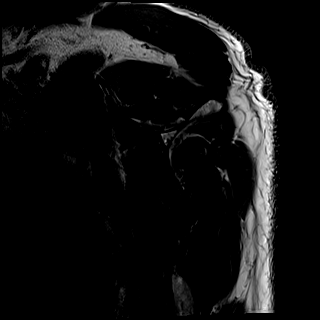
[im 4/27]
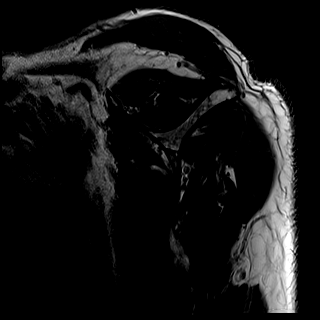
[im 8/27]
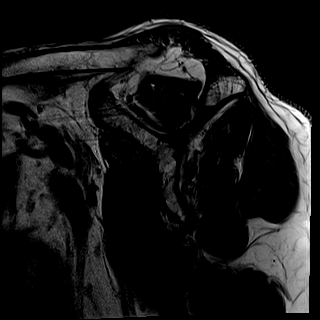
[im 12/27]
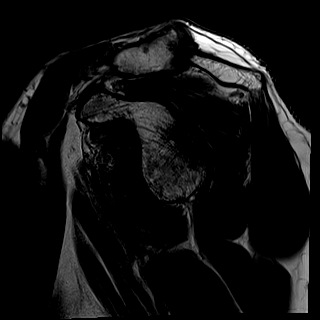

[36 of 40 positions shown; findings below may reference images not displayed]

FINDINGS: Rotator cuff: Large full-thickness retracted rotator cuff tear. The
supraspinatus and infraspinatus tendons are both retracted
approximately 3.5 cm. The superior fibers of the subscapularis
tendon are also torn but the mid and lower fibers are still intact.

Muscles: Moderate edema like signal abnormality tracking back into
the supraspinatus and infraspinatus muscles.

Biceps long head:  Intact

Acromioclavicular Joint: Advanced degenerative changes with joint
fluid and pannus formation. Type 1-2 acromion. No significant
lateral downsloping or undersurface spurring.

Glenohumeral Joint: Mild degenerative changes. Large joint effusion
and moderate synovitis. Thickened appearance of the anterior band of
the inferior glenohumeral ligament likely related to the patient's
dislocation and injury. I do not think is completely torn/ruptured.

Labrum: No obvious anterior inferior labral tear. The superior
posterior labrum appear intact.

Bones: Hill-Sachs impaction type injury involving the humeral head.
No bony Bankart lesion in.

Other: Expected fluid in the subacromial/subdeltoid bursa.
IMPRESSION: 1. Large full-thickness retracted tears of the supraspinatus and
infraspinatus tendons.
2. The subscapularis tendon is intact and the long head biceps
tendon is intact.
3. Hill-Sachs impaction type injury involving the humeral head but
no obvious bony Bankart lesion. No definite soft tissue Bankart
lesion.
4. Thickened appearance of the anterior band of the inferior
glenohumeral ligament likely due to stretching injury from the
dislocation. No complete tear-rupture.

## 2021-06-17 MED ORDER — OXYCODONE HCL 5 MG PO TABS: 5.0000 mg | ORAL_TABLET | ORAL | 0 refills | Status: AC | PRN

## 2021-06-17 MED ORDER — IBUPROFEN 600 MG PO TABS: 600.0000 mg | ORAL_TABLET | Freq: Four times a day (QID) | ORAL | 0 refills | Status: DC | PRN

## 2021-06-17 MED ORDER — ACETAMINOPHEN 500 MG PO TABS: 1000.0000 mg | ORAL_TABLET | Freq: Four times a day (QID) | ORAL | 0 refills | Status: AC | PRN

## 2021-06-22 ENCOUNTER — Ambulatory Visit (INDEPENDENT_AMBULATORY_CARE_PROVIDER_SITE_OTHER): Payer: Medicare Other | Admitting: Podiatry

## 2021-06-22 ENCOUNTER — Ambulatory Visit (INDEPENDENT_AMBULATORY_CARE_PROVIDER_SITE_OTHER): Payer: Medicare Other

## 2021-06-22 ENCOUNTER — Other Ambulatory Visit: Payer: Self-pay

## 2021-06-22 DIAGNOSIS — M21622 Bunionette of left foot: Secondary | ICD-10-CM | POA: Diagnosis not present

## 2021-06-22 DIAGNOSIS — M21621 Bunionette of right foot: Secondary | ICD-10-CM

## 2021-06-23 ENCOUNTER — Encounter: Payer: Medicare Other | Admitting: Podiatry

## 2021-06-26 ENCOUNTER — Encounter: Payer: Self-pay | Admitting: Podiatry

## 2021-06-26 NOTE — Progress Notes (Signed)
  Subjective:  Patient ID: Kenneth Pena, male    DOB: [birth year],  MRN: [id number]  Chief Complaint  Patient presents with   Routine Post Op     (xray)POV #1 DOS 06/17/2021 B/L TAILOR'S BUNION CORRECTION     70 y.o. male returns for post-op check.  Overall doing well  Review of Systems: Negative except as noted in the HPI. Denies N/V/F/Ch.   Objective:  There were no vitals filed for this visit. There is no height or weight on file to calculate BMI. Constitutional Well developed. Well nourished.  Vascular Foot warm and well perfused. Capillary refill normal to all digits.   Neurologic Normal speech. Oriented to person, place, and time. Epicritic sensation to light touch grossly present bilaterally.  Dermatologic Skin healing well without signs of infection. Skin edges well coapted without signs of infection.  Orthopedic: Tenderness to palpation noted about the surgical site.   Multiple view plain film radiographs: Bilateral tailor's bunion with metatarsal osteotomies with Kirschner wire fixation Assessment:   1. Tailor's bunion of both feet    Plan:  Patient was evaluated and treated and all questions answered.  S/p foot surgery bilaterally -Progressing as expected post-operatively. -XR: As above -WB Status: WBAT in surgical shoes -Sutures: Removed at next visit. -Medications: No refills required -Foot redressed.  Return in about 2 weeks (around 07/06/2021) for post op (no x-rays), suture removal.

## 2021-06-27 ENCOUNTER — Other Ambulatory Visit: Payer: Self-pay | Admitting: Podiatry

## 2021-07-06 ENCOUNTER — Ambulatory Visit (INDEPENDENT_AMBULATORY_CARE_PROVIDER_SITE_OTHER): Payer: Medicare Other | Admitting: Podiatry

## 2021-07-06 ENCOUNTER — Other Ambulatory Visit: Payer: Self-pay

## 2021-07-06 DIAGNOSIS — M21622 Bunionette of left foot: Secondary | ICD-10-CM

## 2021-07-06 DIAGNOSIS — M21621 Bunionette of right foot: Secondary | ICD-10-CM

## 2021-07-06 NOTE — Progress Notes (Signed)
  Subjective:  Patient ID: Kenneth Pena, male    DOB: [birth year],  MRN: [id number]  Chief Complaint  Patient presents with   Routine Post Op     for post op (no x-rays), suture removal     70 y.o. male returns for post-op check.  Overall doing well  Review of Systems: Negative except as noted in the HPI. Denies N/V/F/Ch.   Objective:  There were no vitals filed for this visit. There is no height or weight on file to calculate BMI. Constitutional Well developed. Well nourished.  Vascular Foot warm and well perfused. Capillary refill normal to all digits.   Neurologic Normal speech. Oriented to person, place, and time. Epicritic sensation to light touch grossly present bilaterally.  Dermatologic Skin healing well without signs of infection. Skin edges well coapted without signs of infection.  Orthopedic: Tenderness to palpation noted about the surgical site.   Multiple view plain film radiographs: Bilateral tailor's bunion with metatarsal osteotomies with Kirschner wire fixation Assessment:   1. Tailor's bunion of both feet    Plan:  Patient was evaluated and treated and all questions answered.  S/p foot surgery bilaterally -Progressing as expected post-operatively. -XR: As above -WB Status: WBAT in surgical shoes -Sutures: Removed today -Medications: No refills required  Return in 3 weeks for new x-rays and pin removal No follow-ups on file.

## 2021-07-07 ENCOUNTER — Encounter: Payer: Medicare Other | Admitting: Podiatry

## 2021-07-08 ENCOUNTER — Other Ambulatory Visit: Payer: Self-pay | Admitting: Podiatry

## 2021-07-13 ENCOUNTER — Other Ambulatory Visit: Payer: Self-pay

## 2021-07-13 ENCOUNTER — Ambulatory Visit (INDEPENDENT_AMBULATORY_CARE_PROVIDER_SITE_OTHER): Payer: Medicare Other

## 2021-07-13 ENCOUNTER — Ambulatory Visit (INDEPENDENT_AMBULATORY_CARE_PROVIDER_SITE_OTHER): Payer: Medicare Other | Admitting: Podiatry

## 2021-07-13 ENCOUNTER — Encounter: Payer: Self-pay | Admitting: Podiatry

## 2021-07-13 DIAGNOSIS — M21621 Bunionette of right foot: Secondary | ICD-10-CM | POA: Diagnosis not present

## 2021-07-13 DIAGNOSIS — M21622 Bunionette of left foot: Secondary | ICD-10-CM

## 2021-07-16 NOTE — Progress Notes (Signed)
  Subjective:  Patient ID: Kenneth Pena, male    DOB: [birth year],  MRN: [id number]  Chief Complaint  Patient presents with   Post-op Problem    (xrays)POST OP patients pins are coming out per wife.     70 y.o. male returns for post-op check.  Overall doing well, pins have become loosened and are coming out   Review of Systems: Negative except as noted in the HPI. Denies N/V/F/Ch.   Objective:  There were no vitals filed for this visit. There is no height or weight on file to calculate BMI. Constitutional Well developed. Well nourished.  Vascular Foot warm and well perfused. Capillary refill normal to all digits.   Neurologic Normal speech. Oriented to person, place, and time. Epicritic sensation to light touch grossly present bilaterally.  Dermatologic Skin healing well without signs of infection. Skin edges well coapted without signs of infection.  Orthopedic: Tenderness to palpation noted about the surgical site.   Multiple view plain film radiographs: Osteotomy still in good correction, no change in alignment, Kirschner wires have translated distally Assessment:   1. Tailor's bunion of both feet    Plan:  Patient was evaluated and treated and all questions answered.  S/p foot surgery bilaterally -Pins have displaced since last visit, removed today uneventfully.  Radiographs show no change in surgical alignment -Continue surgical shoe b/l -Return in 2 weeks for new x-rays No follow-ups on file.

## 2021-07-27 ENCOUNTER — Ambulatory Visit (INDEPENDENT_AMBULATORY_CARE_PROVIDER_SITE_OTHER): Payer: Medicare Other | Admitting: Podiatry

## 2021-07-27 ENCOUNTER — Other Ambulatory Visit: Payer: Self-pay

## 2021-07-27 ENCOUNTER — Ambulatory Visit (INDEPENDENT_AMBULATORY_CARE_PROVIDER_SITE_OTHER): Payer: Medicare Other

## 2021-07-27 DIAGNOSIS — M21622 Bunionette of left foot: Secondary | ICD-10-CM | POA: Diagnosis not present

## 2021-07-27 DIAGNOSIS — M21621 Bunionette of right foot: Secondary | ICD-10-CM | POA: Diagnosis not present

## 2021-07-28 ENCOUNTER — Encounter: Payer: Self-pay | Admitting: Podiatry

## 2021-07-28 ENCOUNTER — Encounter: Payer: Medicare Other | Admitting: Podiatry

## 2021-07-28 NOTE — Progress Notes (Signed)
  Subjective:  Patient ID: Kenneth Pena, male    DOB: [birth year],  MRN: [id number]  Chief Complaint  Patient presents with   Routine Post Op      (xray)POV #3 DOS 06/17/2021 B/L TAILOR'S BUNION CORRECTION     70 y.o. male returns for post-op check.  Overall doing well, pain is much better now that the pins of been out  Review of Systems: Negative except as noted in the HPI. Denies N/V/F/Ch.   Objective:  There were no vitals filed for this visit. There is no height or weight on file to calculate BMI. Constitutional Well developed. Well nourished.  Vascular Foot warm and well perfused. Capillary refill normal to all digits.   Neurologic Normal speech. Oriented to person, place, and time. Epicritic sensation to light touch grossly present bilaterally.  Dermatologic Skin healing well without signs of infection. Skin edges well coapted without signs of infection.  Orthopedic: Tenderness to palpation noted about the surgical site.   Multiple view plain film radiographs: Osteotomy still in good correction, no change in alignment, early bone callus formation noticed Assessment:   1. Tailor's bunion of both feet    Plan:  Patient was evaluated and treated and all questions answered.  S/p foot surgery bilaterally -May transition into regular shoe gear out of the surgical shoes now at this point.  Discussed with him an increase in activity level and he should try to do this gradually.  Advised some swelling and discomfort is normal. Return in about 6 weeks (around 09/07/2021) for post op (new x-rays).

## 2021-08-24 ENCOUNTER — Ambulatory Visit: Payer: Medicare Other | Admitting: Podiatry

## 2021-09-07 ENCOUNTER — Ambulatory Visit (INDEPENDENT_AMBULATORY_CARE_PROVIDER_SITE_OTHER): Payer: Medicare Other | Admitting: Podiatry

## 2021-09-07 ENCOUNTER — Other Ambulatory Visit: Payer: Self-pay

## 2021-09-07 ENCOUNTER — Ambulatory Visit (INDEPENDENT_AMBULATORY_CARE_PROVIDER_SITE_OTHER): Payer: Medicare Other

## 2021-09-07 DIAGNOSIS — M21622 Bunionette of left foot: Secondary | ICD-10-CM

## 2021-09-07 DIAGNOSIS — M21621 Bunionette of right foot: Secondary | ICD-10-CM

## 2021-09-07 NOTE — Progress Notes (Signed)
°  Subjective:  Patient ID: Kenneth Pena, male    DOB: [birth year],  MRN: [id number]  Chief Complaint  Patient presents with   Routine Post Op    POV #3 DOS 06/17/2021 B/L TAILOR'S BUNION CORRECTION     71 y.o. male returns for post-op check.  Overall doing well, he is able to walk to about 3 miles a day now.  Becomes sore at the end of his walk and after but not severe  Review of Systems: Negative except as noted in the HPI. Denies N/V/F/Ch.   Objective:  There were no vitals filed for this visit. There is no height or weight on file to calculate BMI. Constitutional Well developed. Well nourished.  Vascular Foot warm and well perfused. Capillary refill normal to all digits.   Neurologic Normal speech. Oriented to person, place, and time. Epicritic sensation to light touch grossly present bilaterally.  Dermatologic Skin healing well without signs of infection. Skin edges well coapted without signs of infection.  Orthopedic: He has minimal tenderness to palpation noted about the surgical site.   Multiple view plain film radiographs: Osteotomy still in good correction, no change in alignment, bone callus has been progressing since last visit Assessment:   1. Tailor's bunion of both feet    Plan:  Patient was evaluated and treated and all questions answered.  S/p foot surgery bilaterally -Continue regular shoe gear and activity.  Avoid impact exercise for another 3 months.  Okay to go hiking in good supportive hiking boots.  I will see him back as needed at this point.  Return if symptoms worsen or fail to improve.

## 2021-12-14 DIAGNOSIS — I10 Essential (primary) hypertension: Secondary | ICD-10-CM | POA: Insufficient documentation

## 2022-10-30 ENCOUNTER — Ambulatory Visit (INDEPENDENT_AMBULATORY_CARE_PROVIDER_SITE_OTHER): Payer: Medicare Other | Admitting: Podiatry

## 2022-10-30 ENCOUNTER — Ambulatory Visit (INDEPENDENT_AMBULATORY_CARE_PROVIDER_SITE_OTHER): Payer: Medicare Other

## 2022-10-30 ENCOUNTER — Encounter: Payer: Self-pay | Admitting: Podiatry

## 2022-10-30 VITALS — BP 168/77 | HR 54

## 2022-10-30 DIAGNOSIS — Q6672 Congenital pes cavus, left foot: Secondary | ICD-10-CM

## 2022-10-30 DIAGNOSIS — M722 Plantar fascial fibromatosis: Secondary | ICD-10-CM

## 2022-10-30 DIAGNOSIS — D2372 Other benign neoplasm of skin of left lower limb, including hip: Secondary | ICD-10-CM | POA: Diagnosis not present

## 2022-10-30 DIAGNOSIS — Q6671 Congenital pes cavus, right foot: Secondary | ICD-10-CM | POA: Diagnosis not present

## 2022-10-30 NOTE — Patient Instructions (Signed)
Look for salicylic acid [id number] pads, cream or ointment and apply to the thickened dry skin / calluses. This can be bought over the counter, at a pharmacy or online such as [employer]. Apply at night and wash off in morning

## 2022-10-31 NOTE — Progress Notes (Signed)
  Subjective:  Patient ID: Kenneth Pena, male    DOB: 1951-02-13,  MRN: [id number]  Chief Complaint  Patient presents with   Callouses    "I have this callus on the side of both feet.  I want determine if I have Plantar Fasciitis.  I want my to find out if my insurance will cover orthotics."     72 y.o. male presents with the above complaint. History confirmed with patient.  He returns for follow-up he is doing very well until about 6 months ago.  He started to develop a benign-appearing thickened skin lesion on the outside of the left fifth metatarsal.  Objective:  Physical Exam: warm, good capillary refill, no trophic changes or ulcerative lesions, normal DP and PT pulses, normal sensory exam, and pes cavus foot type, no pain in the plantar arch or plantar fascia, he has a large benign-appearing skin growth on the lateral fifth metatarsal   Radiographs: Multiple views x-ray of both feet: no fracture, dislocation, swelling or degenerative changes noted and good healing of bilateral fifth metatarsal osteotomy with dorsiflexion and medial transposition of the capital fragment Assessment:   1. Plantar fasciitis, bilateral   2. Pes cavus of both feet   3. Benign neoplasm of skin of left lower extremity      Plan:  Patient was evaluated and treated and all questions answered.  We reviewed his radiographs and discussed that his osteotomies are well-healed.  Clinically or radiographically I do not see any evidence of large heel spur or plantar fasciitis.  I think he can continue with regular shoe gear and inserts at this point.  Discussed etiology and treatment of these benign skin lesions in detail with the patient as well as multiple treatment options including blistering agents, chemotherapeutic agents, surgical excision, laser therapy and the indications and roles of the above.  Today, recommended treatment with Cantharone as noted in procedure note below.  He will follow-up with [place]  up with salicylic acid treatment at home on both feet  Procedure: Destruction of Lesion Location: Left fifth metatarsal lateral Instrumentation: 15 blade. Technique: Debridement of lesion to petechial bleeding. Aperture pad applied around lesion. Small amount of canthrone applied to the base of the lesion. Dressing: Dry, sterile, compression dressing. Disposition: Patient tolerated procedure well. Advised to leave dressing on overnight. Thereafter patient to wash the area with soap and water and applied band-aid.     Return if symptoms worsen or fail to improve.

## 2024-03-21 DIAGNOSIS — M1711 Unilateral primary osteoarthritis, right knee: Secondary | ICD-10-CM

## 2024-03-21 HISTORY — DX: Unilateral primary osteoarthritis, right knee: M17.11

## 2024-03-28 DIAGNOSIS — M1711 Unilateral primary osteoarthritis, right knee: Principal | ICD-10-CM | POA: Insufficient documentation

## 2024-04-04 NOTE — Discharge Instructions (Signed)
 Instructions after Total Knee Replacement   Kenneth Pena, Jr., M.D.    Dept. of Orthopaedics & Sports Medicine [facility] [address] [place], [place]  [address]  Phone: [phone]   Fax: [phone]       [email or web address]       DIET: Drink plenty of non-alcoholic fluids. Resume your normal diet. Include foods high in fiber.  ACTIVITY:  You may use crutches or a walker with weight-bearing as tolerated, unless instructed otherwise. You may be weaned off of the walker or crutches by your Physical Therapist.  Do NOT place pillows under the knee. Anything placed under the knee could limit your ability to straighten the knee.   Use the Bone Foam 3 times a day for 30 minutes each session to help straighten the knee. Continue doing gentle exercises. Exercising will reduce the pain and swelling, increase motion, and prevent muscle weakness.   Please continue to use the TED compression stockings for 6 weeks. You may remove the stockings at night, but should reapply them in the morning. Do not drive or operate any equipment until instructed.  WOUND CARE:  The initial dressing (Aquacel) can remain in place for 7 days (see separate instructions). Continue to use the PolarCare or ice packs periodically to reduce pain and swelling. You may bathe or shower after the staples are removed at the first office visit following surgery.  MEDICATIONS: You may resume your regular medications. Please take the pain medication as prescribed on the medication. Do not take pain medication on an empty stomach. Unless instructed otherwise, you should take an enteric-coated aspirin  81 mg. TWICE a day. (This along with elevation will help reduce the possibility of blood clots/phlebitis in your operated leg.) Use a stool softener (such as Senokot-S or Colace) daily and a laxative (such as Miralax  or Dulcolax) as needed to prevent constipation.  Do not drive or drink alcoholic beverages when  taking pain medications.  CALL THE OFFICE FOR: Temperature above 101 degrees Excessive bleeding or drainage on the dressing. Excessive swelling, coldness, or paleness of the toes. Persistent nausea and vomiting.  FOLLOW-UP:  You should have an appointment to return to the office in 10-14 days after surgery. Arrangements have been made for continuation of Physical Therapy (either home therapy or outpatient therapy).     [facility] Department Directory         [email or web address]       [email or web address]          Cardiology  Appointments: [place] - [phone]  Endocrinology  Appointments: [place] [phone] [place] - [phone]  Gastroenterology  Appointments: [place] [phone] [place] - [phone]        General Surgery   Appointments: [facility]  Internal Medicine/Family Medicine  Appointments: [facility] - [phone] [place] - [phone]  Metabolic and Weigh Loss Surgery  Appointments: [facility]        Neurology  Appointments: [place] [phone] [place] - [phone]  Neurosurgery  Appointments: [place]  Obstetrics & Gynecology  Appointments: [place] [phone] [place] - [phone]        Pediatrics  Appointments: [place] [phone] [place] - [phone]  Physiatry  Appointments: [place] [phone]  Physical Therapy  Appointments: [place] - [phone]        Podiatry  Appointments: [place] [phone] [place] - [phone]  Pulmonology  Appointments: [place]  Rheumatology  Appointments: [place] [phone]  [place] Location: [facility]  [address] [place], [place]  [address]  [place] Location: [facility]. [address] [place], [place]  [address]  [place]  Location: [facility] [address] [place], [place]  [address]

## 2024-04-09 ENCOUNTER — Encounter
Admission: RE | Admit: 2024-04-09 | Discharge: 2024-04-09 | Disposition: A | Discharge status: Home / Self Care | Source: Ambulatory Visit | Attending: Orthopedic Surgery | Admitting: Orthopedic Surgery

## 2024-04-09 ENCOUNTER — Other Ambulatory Visit: Payer: Self-pay

## 2024-04-09 DIAGNOSIS — Z01812 Encounter for preprocedural laboratory examination: Secondary | ICD-10-CM | POA: Diagnosis not present

## 2024-04-09 DIAGNOSIS — I10 Essential (primary) hypertension: Secondary | ICD-10-CM

## 2024-04-09 DIAGNOSIS — Z01818 Encounter for other preprocedural examination: Secondary | ICD-10-CM | POA: Insufficient documentation

## 2024-04-09 DIAGNOSIS — M1711 Unilateral primary osteoarthritis, right knee: Secondary | ICD-10-CM | POA: Diagnosis not present

## 2024-04-09 HISTORY — DX: Essential (primary) hypertension: I10

## 2024-04-09 HISTORY — DX: Presence of dental prosthetic device (complete) (partial): Z97.2

## 2024-04-09 HISTORY — DX: Atherosclerotic heart disease of native coronary artery without angina pectoris: I25.10

## 2024-04-09 LAB — C-REACTIVE PROTEIN: CRP: 0.6 mg/dL (ref <1.0)

## 2024-04-09 LAB — URINALYSIS, ROUTINE W REFLEX MICROSCOPIC
Bacteria, UA: NONE SEEN
Bilirubin Urine: NEGATIVE
Glucose, UA: NEGATIVE mg/dL
Ketones, ur: NEGATIVE mg/dL
Leukocytes,Ua: NEGATIVE
Nitrite: NEGATIVE
Protein, ur: NEGATIVE mg/dL
Specific Gravity, Urine: 1.021 (ref 1.005–1.030)
Squamous Epithelial / HPF: 0 /HPF (ref 0–5)
pH: 5 (ref 5.0–8.0)

## 2024-04-09 LAB — CBC WITH DIFFERENTIAL/PLATELET
Abs Immature Granulocytes: 0.02 K/uL (ref 0.00–0.07)
Basophils Absolute: 0 K/uL (ref 0.0–0.1)
Basophils Relative: 1 %
Eosinophils Absolute: 0.2 K/uL (ref 0.0–0.5)
Eosinophils Relative: 5 %
HCT: 40 % (ref 39.0–52.0)
Hemoglobin: 14.2 g/dL (ref 13.0–17.0)
Immature Granulocytes: 1 %
Lymphocytes Relative: 24 %
Lymphs Abs: 1 K/uL (ref 0.7–4.0)
MCH: 30.5 pg (ref 26.0–34.0)
MCHC: 35.5 g/dL (ref 30.0–36.0)
MCV: 85.8 fL (ref 80.0–100.0)
Monocytes Absolute: 0.3 K/uL (ref 0.1–1.0)
Monocytes Relative: 6 %
Neutro Abs: 2.7 K/uL (ref 1.7–7.7)
Neutrophils Relative %: 63 %
Platelets: 190 K/uL (ref 150–400)
RBC: 4.66 MIL/uL (ref 4.22–5.81)
RDW: 12.9 % (ref 11.5–15.5)
WBC: 4.2 K/uL (ref 4.0–10.5)
nRBC: 0 % (ref 0.0–0.2)

## 2024-04-09 LAB — COMPREHENSIVE METABOLIC PANEL WITH GFR
ALT: 30 U/L (ref 0–44)
AST: 40 U/L (ref 15–41)
Albumin: 4 g/dL (ref 3.5–5.0)
Alkaline Phosphatase: 64 U/L (ref 38–126)
Anion gap: 7 (ref 5–15)
BUN: 22 mg/dL (ref 8–23)
CO2: 26 mmol/L (ref 22–32)
Calcium: 9.2 mg/dL (ref 8.9–10.3)
Chloride: 103 mmol/L (ref 98–111)
Creatinine, Ser: 0.88 mg/dL (ref 0.61–1.24)
GFR, Estimated: >60 mL/min (ref >60)
Glucose, Bld: 85 mg/dL (ref 70–99)
Potassium: 4.8 mmol/L (ref 3.5–5.1)
Sodium: 136 mmol/L (ref 135–145)
Total Bilirubin: 1.3 mg/dL — ABNORMAL HIGH (ref 0.0–1.2)
Total Protein: 6.8 g/dL (ref 6.5–8.1)

## 2024-04-09 LAB — SURGICAL PCR SCREEN
MRSA, PCR: NEGATIVE
Staphylococcus aureus: NEGATIVE

## 2024-04-09 LAB — SEDIMENTATION RATE: Sed Rate: 5 mm/h (ref 0–20)

## 2024-04-09 NOTE — Patient Instructions (Addendum)
 Your procedure is scheduled on: Friday, August 29 Report to the Registration Desk on the 1st floor of the [facility] To find out your arrival time, please call [phone] between 1PM - 3PM on: Thursday, August 28 If your arrival time is 6:00 am, do not arrive before that time as the [facility] entrance doors do not open until 6:00 am.  REMEMBER: Instructions that are not followed completely may result in serious medical risk, up to and including death; or upon the discretion of your surgeon and anesthesiologist your surgery may need to be rescheduled.  Do not eat food after midnight the night before surgery.  No gum chewing or hard candies.  You may however, drink CLEAR liquids up to 2 hours before you are scheduled to arrive for your surgery. Do not drink anything within 2 hours of your scheduled arrival time.  Clear liquids include: - water  - apple juice without pulp - gatorade (not RED colors) - black coffee or tea (Do NOT add milk or creamers to the coffee or tea) Do NOT drink anything that is not on this list.  In addition, your doctor has ordered for you to drink the provided:  Ensure Pre-Surgery Clear Carbohydrate Drink  Drinking this carbohydrate drink up to two hours before surgery helps to reduce insulin resistance and improve patient outcomes. Please complete drinking 2 hours before scheduled arrival time.  One week prior to surgery: starting August 22 Stop Anti-inflammatories (NSAIDS) such as Advil , Aleve , Ibuprofen , Motrin , Naproxen , Naprosyn  and Aspirin  based products such as Excedrin, Goody's Powder, BC Powder. Stop ANY OVER THE COUNTER supplements until after surgery.  You may however, continue to take Tylenol  if needed for pain up until the day of surgery.  Continue taking all of your other prescription medications up until the day of surgery.  ON THE DAY OF SURGERY ONLY TAKE THESE MEDICATIONS WITH SIPS OF WATER:  atorvastatin  (LIPITOR )  pantoprazole  (PROTONIX)   No Alcohol for 24 hours before or after surgery.  No Smoking including e-cigarettes for 24 hours before surgery.  No chewable tobacco products for at least 6 hours before surgery.  No nicotine patches on the day of surgery.  Do not use any recreational drugs for at least a week (preferably 2 weeks) before your surgery.  Please be advised that the combination of cocaine and anesthesia may have negative outcomes, up to and including death. If you test positive for cocaine, your surgery will be cancelled.  On the morning of surgery brush your teeth with toothpaste and water, you may rinse your mouth with mouthwash if you wish. Do not swallow any toothpaste or mouthwash.  Use CHG Soap as directed on instruction sheet.  Do not wear jewelry, make-up, hairpins, clips or nail polish.  For welded (permanent) jewelry: bracelets, anklets, waist bands, etc.  Please have this removed prior to surgery.  If it is not removed, there is a chance that hospital personnel will need to cut it off on the day of surgery.  Do not wear lotions, powders, or perfumes.   Do not shave body hair from the neck down 48 hours before surgery.  Contact lenses, hearing aids and dentures may not be worn into surgery.  Do not bring valuables to the hospital. [facility] is not responsible for any missing/lost belongings or valuables.   Notify your doctor if there is any change in your medical condition (cold, fever, infection).  Wear comfortable clothing (specific to your surgery type) to the  hospital.  After surgery, you can help prevent lung complications by doing breathing exercises.  Take deep breaths and cough every 1-2 hours. Your doctor may order a device called an Incentive Spirometer to help you take deep breaths.  If you are being admitted to the hospital overnight, leave your suitcase in the car. After surgery it may be brought to your room.  In case of increased patient census, it may be  necessary for you, the patient, to continue your postoperative care in the Same Day Surgery department.  If you are being discharged the day of surgery, you will not be allowed to drive home. You will need a responsible individual to drive you home and stay with you for 24 hours after surgery.   If you are taking public transportation, you will need to have a responsible individual with you.  Please call the Pre-admissions Testing Dept. at [phone] if you have any questions about these instructions.  Surgery Visitation Policy:  Patients having surgery or a procedure may have two visitors.  Children under the age of 37 must have an adult with them who is not the patient.  Inpatient Visitation:    Visiting hours are 7 a.m. to 8 p.m. Up to four visitors are allowed at one time in a patient room. The visitors may rotate out with other people during the day.  One visitor age 36 or older may stay with the patient overnight and must be in the room by 8 p.m.   Merchandiser, retail to address health-related social needs:  [email or web address]        Pre-operative 5 CHG Bath Instructions   You can play a key role in reducing the risk of infection after surgery. Your skin needs to be as free of germs as possible. You can reduce the number of germs on your skin by washing with CHG (chlorhexidine gluconate) soap before surgery. CHG is an antiseptic soap that kills germs and continues to kill germs even after washing.   DO NOT use if you have an allergy to chlorhexidine/CHG or antibacterial soaps. If your skin becomes reddened or irritated, stop using the CHG and notify one of our RNs at [phone].   Please shower with the CHG soap starting 4 days before surgery using the following schedule:     Please keep in mind the following:  DO NOT shave, including legs and underarms, starting the day of your first shower.   You may shave your face at any point before/day  of surgery.  Place clean sheets on your bed the day you start using CHG soap. Use a clean washcloth (not used since being washed) for each shower. DO NOT sleep with pets once you start using the CHG.   CHG Shower Instructions:  If you choose to wash your hair and private area, wash first with your normal shampoo/soap.  After you use shampoo/soap, rinse your hair and body thoroughly to remove shampoo/soap residue.  Turn the water OFF and apply about 3 tablespoons (45 ml) of CHG soap to a CLEAN washcloth.  Apply CHG soap ONLY FROM YOUR NECK DOWN TO YOUR TOES (washing for 3-5 minutes)  DO NOT use CHG soap on face, private areas, open wounds, or sores.  Pay special attention to the area where your surgery is being performed.  If you are having back surgery, having someone wash your back for you may be helpful. Wait 2 minutes after CHG soap is applied, then you may rinse off the  CHG soap.  Pat dry with a clean towel  Put on clean clothes/pajamas   If you choose to wear lotion, please use ONLY the CHG-compatible lotions on the back of this paper.     Additional instructions for the day of surgery: DO NOT APPLY any lotions, deodorants, cologne, or perfumes.   Put on clean/comfortable clothes.  Brush your teeth.  Ask your nurse before applying any prescription medications to the skin.      CHG Compatible Lotions   Aveeno Moisturizing lotion  Cetaphil Moisturizing Cream  Cetaphil Moisturizing Lotion  Clairol Herbal Essence Moisturizing Lotion, Dry Skin  Clairol Herbal Essence Moisturizing Lotion, Extra Dry Skin  Clairol Herbal Essence Moisturizing Lotion, Normal Skin  Curel Age Defying Therapeutic Moisturizing Lotion with Alpha Hydroxy  Curel Extreme Care Body Lotion  Curel Soothing Hands Moisturizing Hand Lotion  Curel Therapeutic Moisturizing Cream, Fragrance-Free  Curel Therapeutic Moisturizing Lotion, Fragrance-Free  Curel Therapeutic Moisturizing Lotion, Original Formula   Eucerin Daily Replenishing Lotion  Eucerin Dry Skin Therapy Plus Alpha Hydroxy Crme  Eucerin Dry Skin Therapy Plus Alpha Hydroxy Lotion  Eucerin Original Crme  Eucerin Original Lotion  Eucerin Plus Crme Eucerin Plus Lotion  Eucerin TriLipid Replenishing Lotion  Keri Anti-Bacterial Hand Lotion  Keri Deep Conditioning Original Lotion Dry Skin Formula Softly Scented  Keri Deep Conditioning Original Lotion, Fragrance Free Sensitive Skin Formula  Keri Lotion Fast Absorbing Fragrance Free Sensitive Skin Formula  Keri Lotion Fast Absorbing Softly Scented Dry Skin Formula  Keri Original Lotion  Keri Skin Renewal Lotion Keri Silky Smooth Lotion  Keri Silky Smooth Sensitive Skin Lotion  Nivea Body Creamy Conditioning Oil  Nivea Body Extra Enriched Lotion  Nivea Body Original Lotion  Nivea Body Sheer Moisturizing Lotion Nivea Crme  Nivea Skin Firming Lotion  NutraDerm 30 Skin Lotion  NutraDerm Skin Lotion  NutraDerm Therapeutic Skin Cream  NutraDerm Therapeutic Skin Lotion  ProShield Protective Hand Cream  Provon moisturizing lotion          Preoperative Educational Videos for Total Hip, Knee and Shoulder Replacements  To better prepare for surgery, please view our videos that explain the physical activity and discharge planning required to have the best surgical recovery at [facility].  [email or web address]  Questions? Call [phone] or email [email or web address]      How to Use an Incentive Spirometer  An incentive spirometer is a tool that measures how well you are filling your lungs with each breath. Learning to take long, deep breaths using this tool can help you keep your lungs clear and active. This may help to reverse or lessen your chance of developing breathing (pulmonary) problems, especially infection. You may be asked to use a spirometer: After a surgery. If you have a  lung problem or a history of smoking. After a long period of time when you have been unable to move or be active. If the spirometer includes an indicator to show the highest number that you have reached, your health care provider or respiratory therapist will help you set a goal. Keep a log of your progress as told by your health care provider. What are the risks? Breathing too quickly may cause dizziness or cause you to pass out. Take your time so you do not get dizzy or light-headed. If you are in pain, you may need to take pain medicine before doing incentive spirometry. It is harder to take a deep breath if you are having pain. How to use your incentive spirometer  Sit up  on the edge of your bed or on a chair. Hold the incentive spirometer so that it is in an upright position. Before you use the spirometer, breathe out normally. Place the mouthpiece in your mouth. Make sure your lips are closed tightly around it. Breathe in slowly and as deeply as you can through your mouth, causing the piston or the ball to rise toward the top of the chamber. Hold your breath for 3-5 seconds, or for as long as possible. If the spirometer includes a coach indicator, use this to guide you in breathing. Slow down your breathing if the indicator goes above the marked areas. Remove the mouthpiece from your mouth and breathe out normally. The piston or ball will return to the bottom of the chamber. Rest for a few seconds, then repeat the steps 10 or more times. Take your time and take a few normal breaths between deep breaths so that you do not get dizzy or light-headed. Do this every 1-2 hours when you are awake. If the spirometer includes a goal marker to show the highest number you have reached (best effort), use this as a goal to work toward during each repetition. After each set of 10 deep breaths, cough a few times. This will help to make sure that your lungs are clear. If you have an incision on your chest  or abdomen from surgery, place a pillow or a rolled-up towel firmly against the incision when you cough. This can help to reduce pain while taking deep breaths and coughing. General tips When you are able to get out of bed: Walk around often. Continue to take deep breaths and cough in order to clear your lungs. Keep using the incentive spirometer until your health care provider says it is okay to stop using it. If you have been in the hospital, you may be told to keep using the spirometer at home. Contact a health care provider if: You are having difficulty using the spirometer. You have trouble using the spirometer as often as instructed. Your pain medicine is not giving enough relief for you to use the spirometer as told. You have a fever. Get help right away if: You develop shortness of breath. You develop a cough with bloody mucus from the lungs. You have fluid or blood coming from an incision site after you cough. Summary An incentive spirometer is a tool that can help you learn to take long, deep breaths to keep your lungs clear and active. You may be asked to use a spirometer after a surgery, if you have a lung problem or a history of smoking, or if you have been inactive for a long period of time. Use your incentive spirometer as instructed every 1-2 hours while you are awake. If you have an incision on your chest or abdomen, place a pillow or a rolled-up towel firmly against your incision when you cough. This will help to reduce pain. Get help right away if you have shortness of breath, you cough up bloody mucus, or blood comes from your incision when you cough. This information is not intended to replace advice given to you by your health care provider. Make sure you discuss any questions you have with your health care provider. Document Revised: 10/27/2019 Document Reviewed: 10/27/2019 Elsevier Patient Education  2023 [employer].

## 2024-04-16 ENCOUNTER — Encounter: Payer: Self-pay | Admitting: Orthopedic Surgery

## 2024-04-16 NOTE — H&P (Signed)
 ORTHOPAEDIC HISTORY & PHYSICAL  Anyelina Claycomb, Lynwood Idalia Raddle., MD - 03/25/2024 10:00 AM EDT Formatting of this note is different from the original. Images from the original note were not included. Chief Complaint: Chief Complaint Patient presents with Right Knee - Pain DISCUSS SURGERY  Reason for Visit: The patient is a 73 y.o. male who presents today for reevaluation of his right knee. He reports a greater than 5-year history of right knee pain. He localizes most of the pain along the medial and lateral aspect of the knee. He reports some swelling, no locking, and some giving way of the knee. The knee feels stiff. The pain is aggravated by any weight bearing. The knee pain limits the patient's ability to ambulate long distances. The patient has not appreciated any significant improvement despite Tylenol , NSAIDs, intra-articular corticosteroid injections, viscosupplementation, and activity modification. He is not using any ambulatory aids. The patient states that the knee pain has progressed to the point that it is significantly interfering with his activities of daily living.  Medications: Current Outpatient Medications Medication Sig Dispense Refill aspirin  81 MG EC tablet Take 81 mg by mouth once daily atorvastatin  (LIPITOR ) 80 MG tablet Take 1 tablet (80 mg total) by mouth once daily 90 tablet 3 ibuprofen  (MOTRIN ) 600 MG tablet Take 1 tablet by mouth every 6 (six) hours as needed lisinopriL  (ZESTRIL ) 10 MG tablet Take 10 mg by mouth once daily lisinopriL  (ZESTRIL ) 5 MG tablet Take 5 mg by mouth once daily tadalafiL (CIALIS) 5 MG tablet Take 5 mg by mouth once daily tamsulosin (FLOMAX) 0.4 mg capsule Take 0.4 mg by mouth once daily  No current facility-administered medications for this visit.  Allergies: No Known Allergies  Past Medical History: Past Medical History: Diagnosis Date Chicken pox Hx of adenomatous polyp of colon 02/14/2019 Hypertension, essential 12/14/2021 Kidney  stones Primary osteoarthritis of right knee 03/28/2024 Shingles  Past Surgical History: Past Surgical History: Procedure Laterality Date COLONOSCOPY 12/24/1995 Normal Colon EGD 12/24/1995 H Pylori COLONOSCOPY 12/18/2003 Normal Colon COLONOSCOPY 12/31/2013 Adenomatous Polyp: CBF 12/2018 Recall ltr mailed COLONOSCOPY 03/04/2019 Diverticulosis/PHx CP/Repeat 14yrs/TKT (03/14/2024 Recall letter returned.[email or web address]) Right mini-open rotator cuff repair, right open biceps tenodesis, right arthroscopic distal clavicle excision, right arthroscopic extensive debridment of shoulder (glenohumeral and subacromial spaces) right arthroscopic subacromial decompre Right 03/28/2019 Dr. Eleanor WISDOM TEETH  Social History: Social History  Socioeconomic History Marital status: Married Spouse name: SANDRA Number of children: 0 Years of education: 12 Occupational History Comment: RETIRED Tobacco Use Smoking status: Never Smokeless tobacco: Never Vaping Use Vaping status: Never Used Substance and Sexual Activity Alcohol use: Yes Alcohol/week: 6.0 - 12.0 standard drinks of alcohol Types: 6 - 12 Standard drinks or equivalent per week Drug use: Never Sexual activity: Yes Partners: Female  Social Drivers of Catering manager Strain: Low Risk (03/25/2024) Overall Financial Resource Strain (CARDIA) Difficulty of Paying Living Expenses: Not very hard Food Insecurity: No Food Insecurity (03/25/2024) Hunger Vital Sign Worried About Running Out of Food in the Last Year: Never true Ran Out of Food in the Last Year: Never true Transportation Needs: No Transportation Needs (03/25/2024) PRAPARE - Contractor (Medical): No Lack of Transportation (Non-Medical): No Housing Stability: Unknown (03/25/2024) Housing Stability Vital Sign Unable to Pay for Housing in the Last Year: No Homeless in the Last Year: No  Family History: Family History Problem Relation Name Age of  Onset Heart disease Mother No Known Problems Father No Known Problems Sister No Known Problems Brother  Review of Systems: A  comprehensive 14 point ROS was performed, reviewed, and the pertinent orthopaedic findings are documented in the HPI.  Exam BP 124/80  Ht 182.9 cm (6')  Wt 76.6 kg (168 lb 12.8 oz)  BMI 22.89 kg/m  General: Well-developed, well-nourished male seen in no acute distress. Antalgic gait. Varus thrust to the right knee.  HEENT: Atraumatic, normocephalic. Pupils are equal and reactive to light. Extraocular motion is intact. Sclera are clear. Oropharynx is clear with moist mucosa.  Neck: Supple, nontender, and with good ROM. No thyromegaly, adenopathy, JVD, or carotid bruits.  Lungs: Clear to auscultation bilaterally.  Cardiovascular: Regular rate and rhythm. Normal S1, S2. No murmur . No appreciable gallops or rubs. Peripheral pulses are palpable. No lower extremity edema. Homan`s test is negative.  Abdomen: Soft, nontender, nondistended. Bowel sounds are present.  Extremities: Good strength, stability, and range of motion of the upper extremities. Good range of motion of the hips and ankles.  Right Knee: Soft tissue swelling: mild Effusion: minimal Erythema: none Crepitance: mild Tenderness: medial, lateral Alignment: relative varus Mediolateral laxity: medial pseudolaxity Posterior sag: negative Patellar tracking: Good tracking without evidence of subluxation or tilt Atrophy: No significant atrophy. Quadriceps tone was good. Range of motion: 0/0/123 degrees  Neurologic: Awake, alert, and oriented. Sensory function is intact to pinprick and light touch. Motor strength is judged to be 5/5. Motor coordination is within normal limits. No apparent clonus. No tremor.  X-rays: I ordered and interpreted standing AP, lateral, and sunrise radiographs of the right knee that were obtained in the office today. There is significant narrowing of the  medial cartilage space with near bone-on-bone articulation and associated varus alignment. Osteophyte formation is noted. Subchondral sclerosis is noted. No evidence of fracture or dislocation.  Impression: Degenerative arthrosis of the right knee  Plan: The findings were discussed in detail with the patient. The patient was given informational material on total knee replacement. Conservative treatment options were reviewed with the patient. We discussed the risks and benefits of surgical intervention. The usual perioperative course was also discussed in detail. The patient expressed understanding of the risks and benefits of surgical intervention and would like to proceed with plans for right total knee arthroplasty.  I spent a total of 45 minutes in both face-to-face and non-face-to-face activities, excluding procedures performed, for this visit on the date of this encounter.  MEDICAL CLEARANCE: Per anesthesiology and Dr. Ammon (Cardiology). ACTIVITY: As tolerated. WORK STATUS: Not applicable. THERAPY: Preoperative physical therapy evaluation. MEDICATIONS: Requested Prescriptions  No prescriptions requested or ordered in this encounter  FOLLOW-UP: Return for preop History & Physical pending surgery date.  Marshell Rieger P. Jammal Sarr, Jr., M.D.  This note was generated in part with voice recognition software and I apologize for any typographical errors that were not detected and corrected.

## 2024-04-17 ENCOUNTER — Encounter: Payer: Self-pay | Admitting: Urgent Care

## 2024-04-17 NOTE — Progress Notes (Signed)
 Perioperative / Anesthesia Services  Pre-Admission Testing Clinical Review / Pre-Operative Anesthesia Consult  Date: 04/17/24  PATIENT DEMOGRAPHICS: Name: Kenneth Pena DOB: 24-Jan-1951 MRN:   [id number]  Note: Available PAT nursing documentation and vital signs have been reviewed. Clinical nursing staff has updated patient's PMH/PSHx, current medication list, and drug allergies/intolerances to ensure complete and comprehensive history available to assist care teams in MDM as it pertains to the aforementioned surgical procedure and anticipated anesthetic course. Extensive review of available clinical information personally performed. [place] [facility] and PSHx updated with any diagnoses/procedures that  may have been inadvertently omitted during his intake with the pre-admission testing department's nursing staff.  PLANNED SURGICAL PROCEDURE(S):   Case: [id number] Date/Time: 04/18/24 0715   Procedure: ARTHROPLASTY, KNEE, TOTAL, USING IMAGELESS COMPUTER-ASSISTED NAVIGATION (Right: Knee)   Anesthesia type: Choice   Diagnosis: Primary osteoarthritis of right knee [M17.11]   Pre-op diagnosis: Primary osteoarthritis of right knee   Location: [facility] OR ROOM 01 / [facility]   Surgeons: Mardee Lynwood SQUIBB, MD        CLINICAL DISCUSSION: Kenneth Pena is a 73 y.o. male who is submitted for pre-surgical anesthesia review and clearance prior to him undergoing the above procedure. Patient has never been a smoker in the past. Pertinent PMH includes: CAD, sinus bradycardia, HTN. 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   Patient is followed by cardiology Andrea, MD). He was last seen in the cardiology clinic on 12/25/2022; notes reviewed. At the time of his clinic visit, patient doing well overall from a cardiovascular perspective. Patient denied any chest pain, shortness of breath, PND, orthopnea, palpitations, significant peripheral edema, weakness, fatigue,  vertiginous symptoms, or presyncope/syncope. Patient with a past medical history significant for cardiovascular diagnoses. Documented physical exam was grossly benign, providing no evidence of acute exacerbation and/or decompensation of the patient's known cardiovascular conditions.  Myocardial perfusion imaging study was performed on 09/03/2020 revealing a moderately reduced left ventricular systolic function with an EF of 30-44%.  There were no regional wall motion abnormalities.  No artifact or left ventricular cavity size enlargement appreciated on review of imaging. SPECT images demonstrated small defect of moderate severity present in the basal inferoseptal, mid inferoseptal and apical septal location. TID ratio = 1.04. Study determined to be intermediate risk and further evaluation was recommended.   Patient underwent diagnostic LEFT heart catheterization on 09/13/2020 that demonstrated normal left ventricular systolic function. Coronary anatomy revealed significant single vessel CAD causing a 95% stenosis in the proximal LAD. There were minor luminal irregularities noted in a large RCA. PCI was subsequently performed placing a 2.75 x 15 mm Resolute Onyx DES x 1 to the proximal LAD lesion. Procedure yielded excellent angiographic result and TIMI-3 flow.   Blood pressure well controlled at 124/80 mmHg on currently prescribed ACEi (lisinopril ) monotherapy.  Patient is on atorvastatin  for his HLD diagnosis and ASCVD prevention. In the setting of known cardiovascular diagnoses, it is important note that patient is on a PDE5i medication (tadalafil) for an erectile dysfunction diagnosis. Patient is not diabetic. He does not have an OSAH diagnosis. Patient is able to complete all of his  ADL/IADLs without cardiovascular limitation.  Per the DASI, patient is able to achieve at least 4 METS of physical activity without experiencing any significant degree of angina/anginal equivalent symptoms. No changes were  made to his medication regimen during his visit with cardiology.  Patient scheduled to follow-up with outpatient cardiology in 12 months or sooner  if needed.  Kenneth Pena is scheduled for an elective ARTHROPLASTY, KNEE, TOTAL, USING IMAGELESS COMPUTER-ASSISTED NAVIGATION (Right: Knee) on 04/18/2024 with Dr. Lynwood SHAUNNA Hue, MD. Given patient's past medical history significant for cardiovascular diagnoses, presurgical cardiac clearance was sought by the PAT team. Per cardiology, this patient is optimized for surgery and may proceed with the planned procedural course with a LOW risk of significant perioperative cardiovascular complications.  In review of the patient's chart, it is noted that he is on daily oral antithrombotic therapy. Given that patient's past medical history is significant for cardiovascular diagnoses, including but not limited to CAD, orthopedics has cleared patient to continue his daily low dose ASA throughout his perioperative course.  Patient has been updated on these directives from his specialty care providers by the PAT team.  Patient denies previous perioperative complications with anesthesia in the past. In review his EMR, it is noted that patient underwent a general + regional anesthetic course at [facility] ([facility]) in 03/2019 without documented complications.   MOST RECENT VITAL SIGNS:    04/09/2024    8:34 AM 10/30/2022    4:15 PM 09/14/2020    7:49 AM  Vitals with BMI  Height 6' 0    Weight 170 lbs    BMI 23.05    Systolic 131 168 873  Diastolic 81 77 96  Pulse 50 54 48   PROVIDERS/SPECIALISTS: NOTE: Primary physician provider listed below. Patient may have been seen by APP or partner within same practice.   PROVIDER ROLE / SPECIALTY LAST OV  Hooten, Lynwood SHAUNNA, MD Orthopedics (Surgeon) 03/25/2024  Corlis Honor BROCKS, MD Primary Care Provider ???  Ammon Blunt, MD Cardiology 12/25/2023   ALLERGIES: No Known Allergies  CURRENT HOME  MEDICATIONS: No current facility-administered medications for this encounter.    aspirin  EC 81 MG tablet   atorvastatin  (LIPITOR ) 80 MG tablet   lisinopril  (ZESTRIL ) 10 MG tablet   pantoprazole  (PROTONIX ) 40 MG tablet   tadalafil (CIALIS) 5 MG tablet    sodium chloride  flush (NS) 0.9 % injection 3 mL   HISTORY: Past Medical History:  Diagnosis Date   Adenomatous polyp    Coronary artery disease of native artery of native heart with stable angina pectoris (HCC) 09/09/2020   a.) LHC/PCI 09/09/2020: 95% oLAD (2.75 x 15 mm Resolute Onyx DES)   Degenerative arthritis of right knee 03/2024   Dental root implant present (multiple)    Erectile dysfunction    a.) on PDE5i (tadalafil)   Essential hypertension    GERD (gastroesophageal reflux disease)    HLD (hyperlipidemia)    Kidney stones    Long-term use of aspirin  therapy    Shingles    Sinus bradycardia    Past Surgical History:  Procedure Laterality Date   COLONOSCOPY     1997, 2005, 2015, 2020   CORONARY STENT INTERVENTION N/A 09/13/2020   Procedure: CORONARY STENT INTERVENTION;  Surgeon: Florencio Cara BIRCH, MD;  Location: [facility] INVASIVE CV LAB;  Service: Cardiovascular;  Laterality: N/A;   ESOPHAGOGASTRODUODENOSCOPY  1997   LEFT HEART CATH AND CORONARY ANGIOGRAPHY Left 09/13/2020   Procedure: LEFT HEART CATH AND CORONARY ANGIOGRAPHY;  Surgeon: Florencio Cara BIRCH, MD;  Location: [facility] INVASIVE CV LAB;  Service: Cardiovascular;  Laterality: Left;   SHOULDER ARTHROSCOPY WITH OPEN ROTATOR CUFF REPAIR Right 03/28/2019   Procedure: SHOULDER ARTHROSCOPIC SUBSCAPULARIS REPAIR, MINI OPEN ROTATOR CUFF REPAIR, SUBACROMIAL DECOMPRESSION, DISTAL CLAVICLE EXCISION AND BICEPS TENODESIS;  Surgeon: Tobie Priest, MD;  Location: [facility];  Service: Orthopedics;  Laterality: Right;  ARTHREX BICEPS 1.9MM FIBERTAK, SWIVELOCK 4.75MM OR 5.5MM, 3.9MM KNOTLESS  CORKSCREW FOR SUBSCAPULARIS STRYKER SPEED SUTURE ANCHOR OTHER   WISDOM TOOTH  EXTRACTION     History reviewed. No pertinent family history. Social History   Tobacco Use   Smoking status: Never   Smokeless tobacco: Never  Substance Use Topics   Alcohol use: Yes    Alcohol/week: 3.0 standard drinks of alcohol    Types: 3 Cans of beer per week    Comment: two times a week   LABS:  Hospital Outpatient Visit on 04/09/2024  Component Date Value Ref Range Status   CRP 04/09/2024 0.6  <1.0 mg/dL Final   Performed at [facility], [address] [place], [place] [address]   Sed Rate 04/09/2024 5  0 - 20 mm/hr Final   Performed at [facility], [address] [place], [place] [address]   WBC 04/09/2024 4.2  4.0 - 10.5 K/uL Final   RBC 04/09/2024 4.66  4.22 - 5.81 MIL/uL Final   Hemoglobin 04/09/2024 14.2  13.0 - 17.0 g/dL Final   HCT 91/79/7974 40.0  39.0 - 52.0 % Final   MCV 04/09/2024 85.8  80.0 - 100.0 fL Final   MCH 04/09/2024 30.5  26.0 - 34.0 pg Final   MCHC 04/09/2024 35.5  30.0 - 36.0 g/dL Final   RDW 91/79/7974 12.9  11.5 - 15.5 % Final   Platelets 04/09/2024 190  150 - 400 K/uL Final   nRBC 04/09/2024 0.0  0.0 - 0.2 % Final   Neutrophils Relative % 04/09/2024 63  % Final   Neutro Abs 04/09/2024 2.7  1.7 - 7.7 K/uL Final   Lymphocytes Relative 04/09/2024 24  % Final   Lymphs Abs 04/09/2024 1.0  0.7 - 4.0 K/uL Final   Monocytes Relative 04/09/2024 6  % Final   Monocytes Absolute 04/09/2024 0.3  0.1 - 1.0 K/uL Final   Eosinophils Relative 04/09/2024 5  % Final   Eosinophils Absolute 04/09/2024 0.2  0.0 - 0.5 K/uL Final   Basophils Relative 04/09/2024 1  % Final   Basophils Absolute 04/09/2024 0.0  0.0 - 0.1 K/uL Final   Immature Granulocytes 04/09/2024 1  % Final   Abs Immature Granulocytes 04/09/2024 0.02  0.00 - 0.07 K/uL Final   Performed at [facility], [address] [place], [place] [address]   MRSA, PCR 04/09/2024 NEGATIVE  NEGATIVE Final   Staphylococcus aureus 04/09/2024 NEGATIVE  NEGATIVE Final   Comment:  (NOTE) The Xpert SA Assay (FDA approved for NASAL specimens in patients 98 years of age and older), is one component of a comprehensive surveillance program. It is not intended to diagnose infection nor to guide or monitor treatment. Performed at [facility], [address] [place], [place] [address]    Sodium 04/09/2024 136  135 - 145 mmol/L Final   Potassium 04/09/2024 4.8  3.5 - 5.1 mmol/L Final   Chloride 04/09/2024 103  98 - 111 mmol/L Final   CO2 04/09/2024 26  22 - 32 mmol/L Final   Glucose, Bld 04/09/2024 85  70 - 99 mg/dL Final   Glucose reference range applies only to samples taken after fasting for at least 8 hours.   BUN 04/09/2024 22  8 - 23 mg/dL Final   Creatinine, Ser 04/09/2024 0.88  0.61 - 1.24 mg/dL Final   Calcium  04/09/2024 9.2  8.9 - 10.3 mg/dL Final   Total Protein 91/79/7974  6.8  6.5 - 8.1 g/dL Final   Albumin 91/79/7974 4.0  3.5 - 5.0 g/dL Final   AST 91/79/7974 40  15 - 41 U/L Final   ALT 04/09/2024 30  0 - 44 U/L Final   Alkaline Phosphatase 04/09/2024 64  38 - 126 U/L Final   Total Bilirubin 04/09/2024 1.3 (H)  0.0 - 1.2 mg/dL Final   GFR, Estimated 04/09/2024 >60  >60 mL/min Final   Comment: (NOTE) Calculated using the CKD-EPI Creatinine Equation (2021)    Anion gap 04/09/2024 7  5 - 15 Final   Performed at [facility], [address] [place], [place] [address]   Color, Urine 04/09/2024 YELLOW (A)  YELLOW Final   APPearance 04/09/2024 CLEAR (A)  CLEAR Final   Specific Gravity, Urine 04/09/2024 1.021  1.005 - 1.030 Final   pH 04/09/2024 5.0  5.0 - 8.0 Final   Glucose, UA 04/09/2024 NEGATIVE  NEGATIVE mg/dL Final   Hgb urine dipstick 04/09/2024 SMALL (A)  NEGATIVE Final   Bilirubin Urine 04/09/2024 NEGATIVE  NEGATIVE Final   Ketones, ur 04/09/2024 NEGATIVE  NEGATIVE mg/dL Final   Protein, ur 91/79/7974 NEGATIVE  NEGATIVE mg/dL Final   Nitrite 91/79/7974 NEGATIVE  NEGATIVE Final   Leukocytes,Ua 04/09/2024 NEGATIVE  NEGATIVE  Final   RBC / HPF 04/09/2024 0-5  0 - 5 RBC/hpf Final   WBC, UA 04/09/2024 0-5  0 - 5 WBC/hpf Final   Bacteria, UA 04/09/2024 NONE SEEN  NONE SEEN Final   Squamous Epithelial / HPF 04/09/2024 0  0 - 5 /HPF Final   Mucus 04/09/2024 PRESENT   Final   Performed at [facility], [address] [place], [place] [address]    ECG: Date: 04/09/2024  Time ECG obtained: 0841 AM Rate: 45 bpm Rhythm: sinus bradycardia Axis (leads I and aVF): normal Intervals: PR 200 ms. QRS 100 ms. QTc 365 ms. ST segment and T wave changes: No evidence of acute T wave abnormalities or significant ST segment elevation or depression.  Evidence of a possible, age undetermined, prior infarct:  No Comparison: Similar to previous tracing obtained on 09/14/2020. Evidence of lateral infarct was present.    IMAGING / PROCEDURES: DIAGNOSTIC RADIOGRAPHS OF RIGHT KNEE 3+ VIEWS performed on 03/25/2024 Significant narrowing of the medial cartilage space with near bone-on-bone articulation and associated varus alignment.   Osteophyte formation is noted.  Subchondral sclerosis is noted.   No evidence of fracture or dislocation.   LEFT HEART CATHETERIZATION AND CORONARY ANGIOGRAPHY performed on 09/13/2020 Normal left ventricular systolic function with an EF of > 55%. Single vessel CAD with a 95% stenosis of the proximal LAD. RCA with minor luminal irregularities.  Successful PCI A drug-eluting stent was successfully placed using a STENT RESOLUTE ONYX 2.75X15.    MYOCARDIAL PERFUSION IMAGING STUDY (LEXISCAN) performed on 09/03/2020 Moderately reduced left ventricular systolic function with an EF of 30-44%.  Mo significant ST segment deviation noted during stress. Small defect of moderate severity present in the basal inferoseptal, mid inferoseptal and apical septal location.  Good exercise tolerance Findings consistent with ischemia. Will need further evaluation.    IMPRESSION AND PLAN: Kenneth Pena has  been referred for pre-anesthesia review and clearance prior to him undergoing the planned anesthetic and procedural courses. Available labs, pertinent testing, and imaging results were personally reviewed by me in preparation for upcoming operative/procedural course. [facility] medical record has been updated following extensive record review and patient interview with PAT staff.   This patient  has been appropriately cleared by cardiology with an overall LOW risk of patient experiencing significant perioperative cardiovascular complications. Based on clinical review performed today (04/17/24), barring any significant acute changes in the patient's overall condition, it is anticipated that he will be able to proceed with the planned surgical intervention. Any acute changes in clinical condition may necessitate his procedure being postponed and/or cancelled. Patient will meet with anesthesia team (MD and/or CRNA) on the day of his procedure for preoperative evaluation/assessment. Questions regarding anesthetic course will be fielded at that time.   Pre-surgical instructions were reviewed with the patient during his PAT appointment, and questions were fielded to satisfaction by PAT clinical staff. He has been instructed on which medications that he will need to hold prior to surgery, as well as the ones that have been deemed safe/appropriate to take on the day of his procedure. As part of the general education provided by PAT, patient made aware both verbally and in writing, that he would need to abstain from the use of any illegal substances during his perioperative course. He was advised that failure to follow the provided instructions could necessitate case cancellation or result in serious perioperative complications up to and including death. Patient encouraged to contact PAT and/or his surgeon's office to discuss any questions or concerns that may arise prior to surgery; verbalized understanding.   Dorise Pereyra, MSN, APRN, FNP-C, CEN [facility]  Perioperative Services Nurse Practitioner Phone: [phone] Fax: [phone] 04/17/24 12:46 PM  NOTE: This note has been prepared using Dragon dictation software. Despite my best ability to proofread, there is always the potential that unintentional transcriptional errors may still occur from this process.

## 2024-04-18 ENCOUNTER — Encounter
Admission: RE | Disposition: A | Discharge status: Home / Self Care | Payer: Self-pay | Source: Home / Self Care | Attending: Orthopedic Surgery

## 2024-04-18 ENCOUNTER — Ambulatory Visit: Payer: Self-pay | Admitting: Urgent Care

## 2024-04-18 ENCOUNTER — Other Ambulatory Visit: Payer: Self-pay

## 2024-04-18 ENCOUNTER — Observation Stay

## 2024-04-18 ENCOUNTER — Encounter: Payer: Self-pay | Admitting: Orthopedic Surgery

## 2024-04-18 ENCOUNTER — Observation Stay
Admission: RE | Admit: 2024-04-18 | Discharge: 2024-04-19 | Disposition: A | Discharge status: Home / Self Care | Attending: Orthopedic Surgery | Admitting: Orthopedic Surgery

## 2024-04-18 DIAGNOSIS — Z7982 Long term (current) use of aspirin: Secondary | ICD-10-CM | POA: Insufficient documentation

## 2024-04-18 DIAGNOSIS — Z79899 Other long term (current) drug therapy: Secondary | ICD-10-CM | POA: Diagnosis not present

## 2024-04-18 DIAGNOSIS — Z01812 Encounter for preprocedural laboratory examination: Secondary | ICD-10-CM

## 2024-04-18 DIAGNOSIS — M1711 Unilateral primary osteoarthritis, right knee: Principal | ICD-10-CM | POA: Insufficient documentation

## 2024-04-18 DIAGNOSIS — Z96651 Presence of right artificial knee joint: Secondary | ICD-10-CM

## 2024-04-18 DIAGNOSIS — I25118 Atherosclerotic heart disease of native coronary artery with other forms of angina pectoris: Secondary | ICD-10-CM | POA: Diagnosis not present

## 2024-04-18 DIAGNOSIS — I10 Essential (primary) hypertension: Secondary | ICD-10-CM | POA: Insufficient documentation

## 2024-04-18 DIAGNOSIS — M25561 Pain in right knee: Secondary | ICD-10-CM | POA: Diagnosis present

## 2024-04-18 DIAGNOSIS — Z955 Presence of coronary angioplasty implant and graft: Secondary | ICD-10-CM

## 2024-04-18 HISTORY — DX: Bradycardia, unspecified: R00.1

## 2024-04-18 HISTORY — PX: KNEE ARTHROPLASTY: SHX992

## 2024-04-18 HISTORY — DX: Hyperlipidemia, unspecified: E78.5

## 2024-04-18 HISTORY — DX: Zoster without complications: B02.9

## 2024-04-18 HISTORY — DX: Benign neoplasm, unspecified site: D36.9

## 2024-04-18 HISTORY — DX: Male erectile dysfunction, unspecified: N52.9

## 2024-04-18 HISTORY — DX: Long term (current) use of aspirin: Z79.82

## 2024-04-18 SURGERY — ARTHROPLASTY, KNEE, TOTAL, USING IMAGELESS COMPUTER-ASSISTED NAVIGATION
Anesthesia: Spinal | Site: Knee | Laterality: Right

## 2024-04-18 MED ORDER — PANTOPRAZOLE SODIUM 40 MG PO TBEC
40.0000 mg | DELAYED_RELEASE_TABLET | Freq: Two times a day (BID) | ORAL | Status: DC
  Administered 2024-04-18 – 2024-04-19 (×2): 40 mg via ORAL
  Filled 2024-04-18 (×2): qty 1

## 2024-04-18 MED ORDER — TRANEXAMIC ACID-NACL 1000-0.7 MG/100ML-% IV SOLN
INTRAVENOUS | Status: AC
  Filled 2024-04-18: qty 100

## 2024-04-18 MED ORDER — ACETAMINOPHEN 325 MG PO TABS: 325.0000 mg | ORAL_TABLET | Freq: Four times a day (QID) | ORAL | Status: DC | PRN

## 2024-04-18 MED ORDER — ONDANSETRON HCL 4 MG/2ML IJ SOLN
INTRAMUSCULAR | Status: DC | PRN
  Administered 2024-04-18: 4 mg via INTRAVENOUS

## 2024-04-18 MED ORDER — TRAMADOL HCL 50 MG PO TABS: 50.0000 mg | ORAL_TABLET | ORAL | Status: DC | PRN

## 2024-04-18 MED ORDER — SODIUM CHLORIDE (PF) 0.9 % IJ SOLN
INTRAMUSCULAR | Status: DC | PRN
  Administered 2024-04-18: 120 mL via INTRAMUSCULAR

## 2024-04-18 MED ORDER — DEXAMETHASONE SODIUM PHOSPHATE 10 MG/ML IJ SOLN
INTRAMUSCULAR | Status: AC
  Filled 2024-04-18: qty 1

## 2024-04-18 MED ORDER — MENTHOL 3 MG MT LOZG: 1.0000 | LOZENGE | OROMUCOSAL | Status: DC | PRN

## 2024-04-18 MED ORDER — CELECOXIB 200 MG PO CAPS
400.0000 mg | ORAL_CAPSULE | Freq: Once | ORAL | Status: AC
  Administered 2024-04-18: 400 mg via ORAL

## 2024-04-18 MED ORDER — ACETAMINOPHEN 10 MG/ML IV SOLN
INTRAVENOUS | Status: DC | PRN
  Administered 2024-04-18: 1000 mg via INTRAVENOUS

## 2024-04-18 MED ORDER — PROPOFOL 1000 MG/100ML IV EMUL
INTRAVENOUS | Status: AC
  Filled 2024-04-18: qty 100

## 2024-04-18 MED ORDER — TRANEXAMIC ACID-NACL 1000-0.7 MG/100ML-% IV SOLN
INTRAVENOUS | Status: AC
Start: 2024-04-18 — End: 2024-04-18
  Filled 2024-04-18: qty 100

## 2024-04-18 MED ORDER — MIDAZOLAM HCL 2 MG/2ML IJ SOLN
INTRAMUSCULAR | Status: AC
  Filled 2024-04-18: qty 2

## 2024-04-18 MED ORDER — FENTANYL CITRATE (PF) 100 MCG/2ML IJ SOLN
INTRAMUSCULAR | Status: AC
Start: 2024-04-18 — End: 2024-04-18
  Filled 2024-04-18: qty 2

## 2024-04-18 MED ORDER — CEFAZOLIN SODIUM-DEXTROSE 2-4 GM/100ML-% IV SOLN
INTRAVENOUS | Status: AC
  Filled 2024-04-18: qty 100

## 2024-04-18 MED ORDER — DIPHENHYDRAMINE HCL 12.5 MG/5ML PO ELIX: 12.5000 mg | ORAL_SOLUTION | ORAL | Status: DC | PRN

## 2024-04-18 MED ORDER — DEXAMETHASONE SODIUM PHOSPHATE 10 MG/ML IJ SOLN
8.0000 mg | Freq: Once | INTRAMUSCULAR | Status: AC
  Administered 2024-04-18: 8 mg via INTRAVENOUS

## 2024-04-18 MED ORDER — LIDOCAINE HCL (PF) 2 % IJ SOLN
INTRAMUSCULAR | Status: AC
  Filled 2024-04-18: qty 5

## 2024-04-18 MED ORDER — BISACODYL 10 MG RE SUPP: 10.0000 mg | Freq: Every day | RECTAL | Status: DC | PRN

## 2024-04-18 MED ORDER — SURGIPHOR WOUND IRRIGATION SYSTEM - OPTIME
TOPICAL | Status: DC | PRN
  Administered 2024-04-18: 450 mL via TOPICAL

## 2024-04-18 MED ORDER — ALUM & MAG HYDROXIDE-SIMETH 200-200-20 MG/5ML PO SUSP: 30.0000 mL | ORAL | Status: DC | PRN

## 2024-04-18 MED ORDER — BUPIVACAINE HCL (PF) 0.5 % IJ SOLN
INTRAMUSCULAR | Status: AC
  Filled 2024-04-18: qty 10

## 2024-04-18 MED ORDER — FLEET ENEMA RE ENEM: 1.0000 | ENEMA | Freq: Once | RECTAL | Status: DC | PRN

## 2024-04-18 MED ORDER — BUPIVACAINE LIPOSOME 1.3 % IJ SUSP
INTRAMUSCULAR | Status: AC
  Filled 2024-04-18: qty 20

## 2024-04-18 MED ORDER — BUPIVACAINE HCL (PF) 0.5 % IJ SOLN
INTRAMUSCULAR | Status: DC | PRN
  Administered 2024-04-18 (×2): 3 mL via INTRATHECAL

## 2024-04-18 MED ORDER — GABAPENTIN 300 MG PO CAPS
300.0000 mg | ORAL_CAPSULE | Freq: Once | ORAL | Status: AC
  Administered 2024-04-18: 300 mg via ORAL

## 2024-04-18 MED ORDER — EPHEDRINE SULFATE-NACL 50-0.9 MG/10ML-% IV SOSY
PREFILLED_SYRINGE | INTRAVENOUS | Status: DC | PRN
  Administered 2024-04-18: 10 mg via INTRAVENOUS

## 2024-04-18 MED ORDER — ASPIRIN 81 MG PO CHEW
81.0000 mg | CHEWABLE_TABLET | Freq: Two times a day (BID) | ORAL | Status: DC
  Administered 2024-04-18 – 2024-04-19 (×3): 81 mg via ORAL
  Filled 2024-04-18 (×3): qty 1

## 2024-04-18 MED ORDER — SODIUM CHLORIDE 0.9 % IR SOLN
Status: DC | PRN
  Administered 2024-04-18: 3000 mL

## 2024-04-18 MED ORDER — CHLORHEXIDINE GLUCONATE 0.12 % MT SOLN
OROMUCOSAL | Status: AC
  Filled 2024-04-18: qty 15

## 2024-04-18 MED ORDER — LISINOPRIL 10 MG PO TABS
10.0000 mg | ORAL_TABLET | Freq: Every day | ORAL | Status: DC
  Administered 2024-04-19: 10 mg via ORAL
  Filled 2024-04-18: qty 1

## 2024-04-18 MED ORDER — OXYCODONE HCL 5 MG PO TABS: 5.0000 mg | ORAL_TABLET | Freq: Once | ORAL | Status: DC | PRN

## 2024-04-18 MED ORDER — ENSURE PRE-SURGERY PO LIQD
296.0000 mL | Freq: Once | ORAL | Status: AC
  Administered 2024-04-18: 296 mL via ORAL
  Filled 2024-04-18: qty 296

## 2024-04-18 MED ORDER — ATORVASTATIN CALCIUM 80 MG PO TABS
80.0000 mg | ORAL_TABLET | Freq: Every day | ORAL | Status: DC
  Administered 2024-04-19: 80 mg via ORAL
  Filled 2024-04-18: qty 1

## 2024-04-18 MED ORDER — CHLORHEXIDINE GLUCONATE 4 % EX SOLN: 60.0000 mL | Freq: Once | CUTANEOUS | Status: DC

## 2024-04-18 MED ORDER — TRANEXAMIC ACID-NACL 1000-0.7 MG/100ML-% IV SOLN
1000.0000 mg | INTRAVENOUS | Status: AC
  Administered 2024-04-18: 1000 mg via INTRAVENOUS

## 2024-04-18 MED ORDER — ONDANSETRON HCL 4 MG/2ML IJ SOLN: 4.0000 mg | Freq: Four times a day (QID) | INTRAMUSCULAR | Status: DC | PRN

## 2024-04-18 MED ORDER — HYDROMORPHONE HCL 1 MG/ML IJ SOLN: 0.5000 mg | INTRAMUSCULAR | Status: DC | PRN

## 2024-04-18 MED ORDER — CHLORHEXIDINE GLUCONATE 0.12 % MT SOLN
15.0000 mL | Freq: Once | OROMUCOSAL | Status: AC
  Administered 2024-04-18: 15 mL via OROMUCOSAL

## 2024-04-18 MED ORDER — LIDOCAINE HCL (PF) 1 % IJ SOLN
INTRAMUSCULAR | Status: DC | PRN
  Administered 2024-04-18 (×2): 3 mL

## 2024-04-18 MED ORDER — GABAPENTIN 300 MG PO CAPS
ORAL_CAPSULE | ORAL | Status: AC
  Filled 2024-04-18: qty 1

## 2024-04-18 MED ORDER — ORAL CARE MOUTH RINSE: 15.0000 mL | Freq: Once | OROMUCOSAL | Status: AC

## 2024-04-18 MED ORDER — GLYCOPYRROLATE 0.2 MG/ML IJ SOLN
INTRAMUSCULAR | Status: DC | PRN
  Administered 2024-04-18: .2 mg via INTRAVENOUS

## 2024-04-18 MED ORDER — CEFAZOLIN SODIUM-DEXTROSE 2-4 GM/100ML-% IV SOLN
2.0000 g | Freq: Four times a day (QID) | INTRAVENOUS | Status: AC
  Administered 2024-04-18 (×2): 2 g via INTRAVENOUS
  Filled 2024-04-18 (×2): qty 100

## 2024-04-18 MED ORDER — EPHEDRINE 5 MG/ML INJ
INTRAVENOUS | Status: AC
  Filled 2024-04-18: qty 5

## 2024-04-18 MED ORDER — CEFAZOLIN SODIUM-DEXTROSE 2-4 GM/100ML-% IV SOLN
2.0000 g | INTRAVENOUS | Status: AC
  Administered 2024-04-18: 2 g via INTRAVENOUS

## 2024-04-18 MED ORDER — MAGNESIUM HYDROXIDE 400 MG/5ML PO SUSP
30.0000 mL | Freq: Every day | ORAL | Status: DC
  Filled 2024-04-18: qty 30

## 2024-04-18 MED ORDER — ACETAMINOPHEN 10 MG/ML IV SOLN
1000.0000 mg | Freq: Four times a day (QID) | INTRAVENOUS | Status: DC
  Administered 2024-04-18 – 2024-04-19 (×3): 1000 mg via INTRAVENOUS
  Filled 2024-04-18 (×3): qty 100

## 2024-04-18 MED ORDER — FERROUS SULFATE 325 (65 FE) MG PO TABS
325.0000 mg | ORAL_TABLET | Freq: Two times a day (BID) | ORAL | Status: DC
  Administered 2024-04-18 – 2024-04-19 (×2): 325 mg via ORAL
  Filled 2024-04-18 (×2): qty 1

## 2024-04-18 MED ORDER — LACTATED RINGERS IV SOLN: INTRAVENOUS | Status: DC

## 2024-04-18 MED ORDER — MIDAZOLAM HCL 5 MG/5ML IJ SOLN
INTRAMUSCULAR | Status: DC | PRN
  Administered 2024-04-18: 2 mg via INTRAVENOUS

## 2024-04-18 MED ORDER — SENNOSIDES-DOCUSATE SODIUM 8.6-50 MG PO TABS
1.0000 | ORAL_TABLET | Freq: Two times a day (BID) | ORAL | Status: DC
  Administered 2024-04-18 (×2): 1 via ORAL
  Filled 2024-04-18 (×3): qty 1

## 2024-04-18 MED ORDER — TRANEXAMIC ACID-NACL 1000-0.7 MG/100ML-% IV SOLN
1000.0000 mg | Freq: Once | INTRAVENOUS | Status: AC
  Administered 2024-04-18: 1000 mg via INTRAVENOUS

## 2024-04-18 MED ORDER — LIDOCAINE HCL (CARDIAC) PF 100 MG/5ML IV SOSY
PREFILLED_SYRINGE | INTRAVENOUS | Status: DC | PRN
  Administered 2024-04-18: 60 mg via INTRAVENOUS

## 2024-04-18 MED ORDER — SODIUM CHLORIDE (PF) 0.9 % IJ SOLN
INTRAMUSCULAR | Status: AC
  Filled 2024-04-18: qty 40

## 2024-04-18 MED ORDER — ONDANSETRON HCL 4 MG/2ML IJ SOLN
INTRAMUSCULAR | Status: AC
  Filled 2024-04-18: qty 2

## 2024-04-18 MED ORDER — OXYCODONE HCL 5 MG PO TABS: 5.0000 mg | ORAL_TABLET | ORAL | Status: DC | PRN

## 2024-04-18 MED ORDER — FENTANYL CITRATE (PF) 100 MCG/2ML IJ SOLN: 25.0000 ug | INTRAMUSCULAR | Status: DC | PRN

## 2024-04-18 MED ORDER — PROPOFOL 500 MG/50ML IV EMUL
INTRAVENOUS | Status: DC | PRN
  Administered 2024-04-18: 75 ug/kg/min via INTRAVENOUS
  Administered 2024-04-18: 60 ug/kg/min via INTRAVENOUS

## 2024-04-18 MED ORDER — OXYCODONE HCL 5 MG/5ML PO SOLN: 5.0000 mg | Freq: Once | ORAL | Status: DC | PRN

## 2024-04-18 MED ORDER — GLYCOPYRROLATE 0.2 MG/ML IJ SOLN
INTRAMUSCULAR | Status: AC
  Filled 2024-04-18: qty 1

## 2024-04-18 MED ORDER — CELECOXIB 200 MG PO CAPS
ORAL_CAPSULE | ORAL | Status: AC
Start: 2024-04-18 — End: 2024-04-18
  Filled 2024-04-18: qty 2

## 2024-04-18 MED ORDER — PHENOL 1.4 % MT LIQD: 1.0000 | OROMUCOSAL | Status: DC | PRN

## 2024-04-18 MED ORDER — SODIUM CHLORIDE 0.9 % IV SOLN: INTRAVENOUS | Status: DC

## 2024-04-18 MED ORDER — ACETAMINOPHEN 10 MG/ML IV SOLN
INTRAVENOUS | Status: AC
  Filled 2024-04-18: qty 100

## 2024-04-18 MED ORDER — OXYCODONE HCL 5 MG PO TABS: 10.0000 mg | ORAL_TABLET | ORAL | Status: DC | PRN

## 2024-04-18 MED ORDER — CELECOXIB 200 MG PO CAPS
200.0000 mg | ORAL_CAPSULE | Freq: Two times a day (BID) | ORAL | Status: DC
  Administered 2024-04-18 – 2024-04-19 (×2): 200 mg via ORAL
  Filled 2024-04-18 (×2): qty 1

## 2024-04-18 MED ORDER — FENTANYL CITRATE (PF) 100 MCG/2ML IJ SOLN
INTRAMUSCULAR | Status: DC | PRN
  Administered 2024-04-18 (×2): 50 ug via INTRAVENOUS

## 2024-04-18 MED ORDER — ONDANSETRON HCL 4 MG PO TABS: 4.0000 mg | ORAL_TABLET | Freq: Four times a day (QID) | ORAL | Status: DC | PRN

## 2024-04-18 MED ORDER — METOCLOPRAMIDE HCL 5 MG PO TABS
10.0000 mg | ORAL_TABLET | Freq: Three times a day (TID) | ORAL | Status: DC
  Administered 2024-04-18 – 2024-04-19 (×3): 10 mg via ORAL
  Filled 2024-04-18 (×3): qty 2

## 2024-04-18 MED ORDER — PROPOFOL 10 MG/ML IV BOLUS
INTRAVENOUS | Status: DC | PRN
  Administered 2024-04-18: 20 mg via INTRAVENOUS

## 2024-04-18 MED ORDER — BUPIVACAINE HCL (PF) 0.25 % IJ SOLN
INTRAMUSCULAR | Status: AC
  Filled 2024-04-18: qty 60

## 2024-04-18 SURGICAL SUPPLY — 64 items
ATTUNE MED DOME PAT 41 KNEE (Knees) IMPLANT
ATTUNE PS FEM RT SZ 8 CEM KNEE (Femur) IMPLANT
ATTUNE PSRP INSR SZ8 5 KNEE (Insert) IMPLANT
BASE TIBIAL ROT PLAT SZ 8 KNEE (Knees) IMPLANT
BATTERY INSTRU NAVIGATION (MISCELLANEOUS) ×4 IMPLANT
BIT DRILL QUICK REL 1/8 2PK SL (BIT) ×1 IMPLANT
BLADE CLIPPER SURG (BLADE) IMPLANT
BLADE SAW 70X12.5 (BLADE) ×1 IMPLANT
BLADE SAW 90X13X1.19 OSCILLAT (BLADE) ×1 IMPLANT
BLADE SAW 90X25X1.19 OSCILLAT (BLADE) ×1 IMPLANT
BRUSH SCRUB EZ PLAIN DRY (MISCELLANEOUS) ×1 IMPLANT
CEMENT HV SMART SET (Cement) IMPLANT
COOLER ICEMAN CLASSIC (MISCELLANEOUS) ×1 IMPLANT
CUFF TRNQT CYL 24X4X16.5-23 (TOURNIQUET CUFF) IMPLANT
CUFF TRNQT CYL 30X4X21-28X (TOURNIQUET CUFF) IMPLANT
DRAPE SHEET LG 3/4 BI-LAMINATE (DRAPES) ×1 IMPLANT
DRSG AQUACEL AG ADV 3.5X14 (GAUZE/BANDAGES/DRESSINGS) ×1 IMPLANT
DRSG MEPILEX SACRM 8.7X9.8 (GAUZE/BANDAGES/DRESSINGS) ×1 IMPLANT
DRSG TEGADERM 4X4.75 (GAUZE/BANDAGES/DRESSINGS) ×1 IMPLANT
DURAPREP 26ML APPLICATOR (WOUND CARE) ×2 IMPLANT
ELECT CAUTERY BLADE 6.4 (BLADE) ×1 IMPLANT
ELECTRODE REM PT RTRN 9FT ADLT (ELECTROSURGICAL) ×1 IMPLANT
EVACUATOR 1/8 PVC DRAIN (DRAIN) ×1 IMPLANT
EX-PIN ORTHOLOCK NAV 4X150 (PIN) ×2 IMPLANT
GAUZE XEROFORM 1X8 LF (GAUZE/BANDAGES/DRESSINGS) ×1 IMPLANT
GLOVE BIOGEL M STRL SZ7.5 (GLOVE) ×6 IMPLANT
GLOVE BIOGEL PI IND STRL 8 (GLOVE) ×1 IMPLANT
GLOVE SRG 8 PF TXTR STRL LF DI (GLOVE) ×1 IMPLANT
GOWN STRL REUS W/ TWL LRG LVL3 (GOWN DISPOSABLE) ×1 IMPLANT
GOWN STRL REUS W/ TWL XL LVL3 (GOWN DISPOSABLE) ×1 IMPLANT
GOWN TOGA ZIPPER T7+ PEEL AWAY (MISCELLANEOUS) ×1 IMPLANT
HOLDER FOLEY CATH W/STRAP (MISCELLANEOUS) ×1 IMPLANT
HOOD PEEL AWAY T7 (MISCELLANEOUS) ×1 IMPLANT
KIT TURNOVER KIT A (KITS) ×1 IMPLANT
KNIFE SCULPS 14X20 (INSTRUMENTS) ×1 IMPLANT
MANIFOLD NEPTUNE II (INSTRUMENTS) ×2 IMPLANT
NDL SPNL 20GX3.5 QUINCKE YW (NEEDLE) ×2 IMPLANT
NEEDLE SPNL 20GX3.5 QUINCKE YW (NEEDLE) ×2 IMPLANT
PACK TOTAL KNEE (MISCELLANEOUS) ×1 IMPLANT
PAD ABD DERMACEA PRESS 5X9 (GAUZE/BANDAGES/DRESSINGS) ×2 IMPLANT
PAD ARMBOARD POSITIONER FOAM (MISCELLANEOUS) ×3 IMPLANT
PAD COLD UNI WRAP-ON (PAD) ×1 IMPLANT
PENCIL SMOKE EVACUATOR COATED (MISCELLANEOUS) ×1 IMPLANT
PIN DRILL FIX HALF THREAD (BIT) ×2 IMPLANT
PIN FIXATION 1/8DIA X 3INL (PIN) ×1 IMPLANT
SOL .9 NS 3000ML IRR UROMATIC (IV SOLUTION) ×1 IMPLANT
SOLUTION IRRIG SURGIPHOR (IV SOLUTION) ×1 IMPLANT
SPONGE DRAIN TRACH 4X4 STRL 2S (GAUZE/BANDAGES/DRESSINGS) ×1 IMPLANT
SPONGE T-LAP 18X18 ~~LOC~~+RFID (SPONGE) IMPLANT
STAPLER SKIN PROX 35W (STAPLE) ×1 IMPLANT
STOCKINETTE IMPERV 14X48 (MISCELLANEOUS) ×1 IMPLANT
STOCKINETTE STRL BIAS CUT 8X4 (MISCELLANEOUS) ×1 IMPLANT
STRAP TIBIA SHORT (MISCELLANEOUS) ×1 IMPLANT
SUCTION TUBE FRAZIER 10FR DISP (SUCTIONS) ×1 IMPLANT
SUT VIC AB 0 CT1 36 (SUTURE) ×1 IMPLANT
SUT VIC AB 1 CT1 36 (SUTURE) ×2 IMPLANT
SUT VIC AB 2-0 CT2 27 (SUTURE) ×1 IMPLANT
SYR 30ML LL (SYRINGE) ×2 IMPLANT
TIP FAN IRRIG PULSAVAC PLUS (DISPOSABLE) ×1 IMPLANT
TOWEL OR 17X26 4PK STRL BLUE (TOWEL DISPOSABLE) IMPLANT
TOWER CARTRIDGE SMART MIX (DISPOSABLE) ×1 IMPLANT
TRAP FLUID SMOKE EVACUATOR (MISCELLANEOUS) ×1 IMPLANT
TRAY FOLEY MTR SLVR 16FR STAT (SET/KITS/TRAYS/PACK) ×1 IMPLANT
WATER STERILE IRR 1000ML POUR (IV SOLUTION) ×1 IMPLANT

## 2024-04-18 NOTE — Anesthesia Procedure Notes (Signed)
 Spinal  Patient location during procedure: OR Start time: 04/18/2024 7:35 AM End time: 04/18/2024 7:40 AM Reason for block: surgical anesthesia Staffing Performed: resident/CRNA  Resident/CRNA: Trudy Rankin LABOR, CRNA Performed by: Trudy Rankin LABOR, CRNA Authorized by: Leavy Ned, MD   Preanesthetic Checklist Completed: patient identified, IV checked, site marked, risks and benefits discussed, surgical consent, monitors and equipment checked, pre-op evaluation and timeout performed Spinal Block Patient position: sitting Prep: Betadine Patient monitoring: heart rate, cardiac monitor, continuous pulse ox and blood pressure Approach: midline Location: L4-5 Injection technique: single-shot Needle Needle type: Sprotte and Pencan  Needle gauge: 24 G Needle length: 9 cm Assessment Sensory level: T4 Events: CSF return Additional Notes Negative paresthesia. Negative blood return. Positive free-flowing CSF. Expiration date of kit checked and confirmed. Patient tolerated procedure well, without complications.

## 2024-04-18 NOTE — Anesthesia Preprocedure Evaluation (Signed)
 Anesthesia Evaluation  Patient identified by MRN, date of birth, ID band Patient awake    Reviewed: Allergy & Precautions, NPO status , Patient's Chart, lab work & pertinent test results  History of Anesthesia Complications Negative for: history of anesthetic complications  Airway Mallampati: II  TM Distance: >3 FB Neck ROM: Full    Dental  (+) Implants   Pulmonary neg pulmonary ROS   Pulmonary exam normal breath sounds clear to auscultation       Cardiovascular Exercise Tolerance: Good hypertension, + CAD  Normal cardiovascular exam Rhythm:Regular Rate:Normal     Neuro/Psych negative neurological ROS  negative psych ROS   GI/Hepatic negative GI ROS, Neg liver ROS,GERD  Medicated and Controlled,,  Endo/Other  negative endocrine ROS    Renal/GU      Musculoskeletal   Abdominal   Peds  Hematology negative hematology ROS (+)   Anesthesia Other Findings Past Medical History: No date: Adenomatous polyp 09/09/2020: Coronary artery disease of native artery of native heart  with stable angina pectoris (HCC)     Comment:  a.) LHC/PCI 09/09/2020: 95% oLAD (2.75 x 15 mm Resolute               Onyx DES) 03/2024: Degenerative arthritis of right knee No date: Dental root implant present (multiple) No date: Erectile dysfunction     Comment:  a.) on PDE5i (tadalafil) No date: Essential hypertension No date: GERD (gastroesophageal reflux disease) No date: HLD (hyperlipidemia) No date: Kidney stones No date: Long-term use of aspirin  therapy No date: Shingles No date: Sinus bradycardia  Past Surgical History: No date: COLONOSCOPY     Comment:  1997, 2005, 2015, 2020 09/13/2020: CORONARY STENT INTERVENTION; N/A     Comment:  Procedure: CORONARY STENT INTERVENTION;  Surgeon:               Florencio Cara BIRCH, MD;  Location: [facility] INVASIVE CV LAB;               Service: Cardiovascular;  Laterality: N/A; 1997:  ESOPHAGOGASTRODUODENOSCOPY 09/13/2020: LEFT HEART CATH AND CORONARY ANGIOGRAPHY; Left     Comment:  Procedure: LEFT HEART CATH AND CORONARY ANGIOGRAPHY;                Surgeon: Florencio Cara BIRCH, MD;  Location: [facility] INVASIVE              CV LAB;  Service: Cardiovascular;  Laterality: Left; 03/28/2019: SHOULDER ARTHROSCOPY WITH OPEN ROTATOR CUFF REPAIR; Right     Comment:  Procedure: SHOULDER ARTHROSCOPIC SUBSCAPULARIS REPAIR,               MINI OPEN ROTATOR CUFF REPAIR, SUBACROMIAL DECOMPRESSION,              DISTAL CLAVICLE EXCISION AND BICEPS TENODESIS;  Surgeon:               Tobie Priest, MD;  Location: [facility];                Service: Orthopedics;  Laterality: Right;  ARTHREX BICEPS              1.9MM FIBERTAK, SWIVELOCK 4.75MM OR 5.5MM, 3.9MM KNOTLESS              CORKSCREW FOR SUBSCAPULARIS STRYKER SPEED SUTURE               ANCHOR OTHER No date: WISDOM TOOTH EXTRACTION  BMI    Body Mass Index: 23.06 kg/m      Reproductive/Obstetrics negative  OB ROS                              Anesthesia Physical Anesthesia Plan  ASA: 2  Anesthesia Plan: General/Spinal   Post-op Pain Management:    Induction: Intravenous  PONV Risk Score and Plan: 2 and Ondansetron , Dexamethasone , Propofol  infusion, TIVA and Midazolam   Airway Management Planned: Natural Airway and Nasal Cannula  Additional Equipment:   Intra-op Plan:   Post-operative Plan:   Informed Consent: I have reviewed the patients History and Physical, chart, labs and discussed the procedure including the risks, benefits and alternatives for the proposed anesthesia with the patient or authorized representative who has indicated his/her understanding and acceptance.     Dental Advisory Given  Plan Discussed with: Anesthesiologist, CRNA and Surgeon  Anesthesia Plan Comments: (Patient reports no bleeding problems and no anticoagulant use.  Plan for spinal with backup  GA  Patient consented for risks of anesthesia including but not limited to:  - adverse reactions to medications - damage to eyes, teeth, lips or other oral mucosa - nerve damage due to positioning  - risk of bleeding, infection and or nerve damage from spinal that could lead to paralysis - risk of headache or failed spinal - damage to teeth, lips or other oral mucosa - sore throat or hoarseness - damage to heart, brain, nerves, lungs, other parts of body or loss of life  Patient voiced understanding and assent.)        Anesthesia Quick Evaluation

## 2024-04-18 NOTE — Transfer of Care (Signed)
 Immediate Anesthesia Transfer of Care Note  Patient: Kenneth Pena [facility]  Procedure(s) Performed: ARTHROPLASTY, KNEE, TOTAL, USING IMAGELESS COMPUTER-ASSISTED NAVIGATION (Right: Knee)  Patient Location: PACU  Anesthesia Type:Spinal  Level of Consciousness: awake and alert   Airway & Oxygen Therapy: Patient Spontanous Breathing  Post-op Assessment: Report given to RN and Post -op Vital signs reviewed and stable  Post vital signs: Reviewed and stable  Last Vitals:  Vitals Value Taken Time  BP 122/76 04/18/24 11:45  Temp 97.6 F 1145  Pulse 55 04/18/24 11:47  Resp 14 04/18/24 11:47  SpO2 96 % 04/18/24 11:47  Vitals shown include unfiled device data.  Last Pain:  Vitals:   04/18/24 0646  TempSrc: Temporal  PainSc: 7       Patients Stated Pain Goal: 0 (04/18/24 0646)  Complications: No notable events documented.

## 2024-04-18 NOTE — Op Note (Signed)
 OPERATIVE NOTE  DATE OF SURGERY:  04/18/2024  PATIENT NAME:  Kenneth Pena   DOB: 04/26/1951  MRN: [id number]  PRE-OPERATIVE DIAGNOSIS: Degenerative arthrosis of the right knee, primary  POST-OPERATIVE DIAGNOSIS:  Same  PROCEDURE:  Right total knee arthroplasty using computer-assisted navigation  SURGEON:  Lynwood SHAUNNA Mardee Mickey. M.D.  ANESTHESIA: spinal  ESTIMATED BLOOD LOSS: 50 mL  FLUIDS REPLACED: 1400 mL of crystalloid  TOURNIQUET TIME: 101 minutes  DRAINS: 2 medium Hemovac drains  SOFT TISSUE RELEASES: Anterior cruciate ligament, posterior cruciate ligament, deep medial collateral ligament, patellofemoral ligament  IMPLANTS UTILIZED: DePuy Attune size 8 posterior stabilized femoral component (cemented), size 8 rotating platform tibial component (cemented), 41 mm medialized dome patella (cemented), and a 5 mm stabilized rotating platform polyethylene insert.  INDICATIONS FOR SURGERY: Kenneth Pena is a 73 y.o. year old male with a long history of progressive knee pain. X-rays demonstrated severe degenerative changes in tricompartmental fashion. The patient had not seen any significant improvement despite conservative nonsurgical intervention. After discussion of the risks and benefits of surgical intervention, the patient expressed understanding of the risks benefits and agree with plans for total knee arthroplasty.   The risks, benefits, and alternatives were discussed at length including but not limited to the risks of infection, bleeding, nerve injury, stiffness, blood clots, the need for revision surgery, cardiopulmonary complications, among others, and they were willing to proceed.  PROCEDURE IN DETAIL: The patient was brought into the operating room and, after adequate spinal anesthesia was achieved, a tourniquet was placed on the patient's upper thigh. The patient's knee and leg were cleaned and prepped with alcohol and DuraPrep and draped in the usual sterile fashion. A  timeout was performed as per usual protocol. The lower extremity was exsanguinated using an Esmarch, and the tourniquet was inflated to 300 mmHg. An anterior longitudinal incision was made followed by a standard mid vastus approach. The deep fibers of the medial collateral ligament were elevated in a subperiosteal fashion off of the medial flare of the tibia so as to maintain a continuous soft tissue sleeve. The patella was subluxed laterally and the patellofemoral ligament was incised. Inspection of the knee demonstrated severe degenerative changes with full-thickness loss of articular cartilage. Osteophytes were debrided using a rongeur. Anterior and posterior cruciate ligaments were excised. Two 4.0 mm Schanz pins were inserted in the femur and into the tibia for attachment of the array of trackers used for computer-assisted navigation. Hip center was identified using a circumduction technique. Distal landmarks were mapped using the computer. The distal femur and proximal tibia were mapped using the computer. The distal femoral cutting guide was positioned using computer-assisted navigation so as to achieve a 5 distal valgus cut. The femur was sized and it was felt that a size 8 femoral component was appropriate. A size 8 femoral cutting guide was positioned and the anterior cut was performed and verified using the computer. This was followed by completion of the posterior and chamfer cuts. Femoral cutting guide for the central box was then positioned in the center box cut was performed.  Attention was then directed to the proximal tibia. Medial and lateral menisci were excised. The extramedullary tibial cutting guide was positioned using computer-assisted navigation so as to achieve a 0 varus-valgus alignment and 3 posterior slope. The cut was performed and verified using the computer. The proximal tibia was sized and it was felt that a size 8 tibial tray was appropriate. Tibial and femoral trials were  inserted followed  by insertion of a 5 mm polyethylene insert. This allowed for excellent mediolateral soft tissue balancing both in flexion and in full extension. Finally, the patella was cut and prepared so as to accommodate a 41 mm medialized dome patella. A patella trial was placed and the knee was placed through a range of motion with excellent patellar tracking appreciated. The femoral trial was removed after debridement of posterior osteophytes. The central post-hole for the tibial component was reamed followed by insertion of a keel punch. Tibial trials were then removed. Cut surfaces of bone were irrigated with copious amounts of normal saline using pulsatile lavage and then suctioned dry. Polymethylmethacrylate cement was prepared in the usual fashion using a vacuum mixer. Cement was applied to the cut surface of the proximal tibia as well as along the undersurface of a size 8 rotating platform tibial component. Tibial component was positioned and impacted into place. Excess cement was removed using Personal assistant. Cement was then applied to the cut surfaces of the femur as well as along the posterior flanges of the size 8 femoral component. The femoral component was positioned and impacted into place. Excess cement was removed using Personal assistant. A 5 mm polyethylene trial was inserted and the knee was brought into full extension with steady axial compression applied. Finally, cement was applied to the backside of a 41 mm medialized dome patella and the patellar component was positioned and patellar clamp applied. Excess cement was removed using Personal assistant. After adequate curing of the cement, the tourniquet was deflated after a total tourniquet time of 101 minutes. Hemostasis was achieved using electrocautery. The knee was irrigated with copious amounts of normal saline using pulsatile lavage followed by 450 ml of Surgiphor and then suctioned dry. 20 mL of 1.3% Exparel  and 60 mL of 0.25% Marcaine   in 40 mL of normal saline was injected along the posterior capsule, medial and lateral gutters, and along the arthrotomy site. A 5 mm stabilized rotating platform polyethylene insert was inserted and the knee was placed through a range of motion with excellent mediolateral soft tissue balancing appreciated and excellent patellar tracking noted. 2 medium drains were placed in the wound bed and brought out through separate stab incisions. The medial parapatellar portion of the incision was reapproximated using interrupted sutures of #1 Vicryl. Subcutaneous tissue was approximated in layers using first #0 Vicryl followed #2-0 Vicryl. The skin was approximated with skin staples. A sterile dressing was applied.  The patient tolerated the procedure well and was transported to the recovery room in stable condition.    Berta Denson P. Shailene Demonbreun, Jr., M.D.

## 2024-04-18 NOTE — Anesthesia Procedure Notes (Addendum)
 Spinal  Patient location during procedure: OR Start time: 04/18/2024 7:20 AM End time: 04/18/2024 7:25 AM Reason for block: surgical anesthesia Staffing Performed: resident/CRNA  Resident/CRNA: Trudy Rankin LABOR, CRNA Performed by: Trudy Rankin LABOR, CRNA Authorized by: Leavy Ned, MD   Preanesthetic Checklist Completed: patient identified, IV checked, site marked, risks and benefits discussed, surgical consent, monitors and equipment checked, pre-op evaluation and timeout performed Spinal Block Patient position: sitting Prep: Betadine Patient monitoring: heart rate, continuous pulse ox, blood pressure and cardiac monitor Approach: midline Location: L3-4 Injection technique: single-shot Needle Needle type: Introducer and Pencan  Needle gauge: 24 G Needle length: 9 cm Assessment Events: CSF return Additional Notes Negative paresthesia. Negative blood return. Positive free-flowing CSF. Expiration date of kit checked and confirmed. Patient tolerated procedure well, without complications.

## 2024-04-18 NOTE — Evaluation (Signed)
 Physical Therapy Evaluation Patient Details Name: Kenneth Pena MRN: [id number] DOB: March 04, 1951 Today's Date: 04/18/2024  History of Present Illness  73 y/o pt is POD 0 (04/18/2024) from R TKA. PMH includes: HTN, sinus bradycardia, CAD, and OA.  Clinical Impression  Pt is a pleasant 73 year old male who was admitted for R TKA. Prior to surgery, pt ambulated without AD and was independent with ADLs.  Pt now performs bed mobility independently with min verbal cuing to scoot to the EOB. He endorsed dizziness upon sitting at EOB (BP: 172/9mmHg, SpO2: 98, HR: 47bpm). Nursing notified of symptoms and vitals. Pt performs transfers and ambulation with CGA. Pt able to perform R SLR without physical assistance. Pt demonstrated ability to perform side steps at the EOB with CGA. Upon examination of active dorsiflexion, pt demonstrated inability to obtain full dorsiflexion on is LLE (non-surgical side). D/t decreased active dorsiflexion, fwd/bwd stepping not tested for safety. MD notified. Pt was educated about weight bearing precautions and use of pillow or bone foam under RLE to aid with knee extension. Pt demonstrates deficits with strength/balance/ROM. Would benefit from skilled PT to address above deficits and promote optimal return to PLOF.          If plan is discharge home, recommend the following: A little help with walking and/or transfers;A little help with bathing/dressing/bathroom;Assistance with cooking/housework;Help with stairs or ramp for entrance   Can travel by private vehicle        Equipment Recommendations Rolling walker (2 wheels);BSC/3in1  Recommendations for Other Services       Functional Status Assessment Patient has had a recent decline in their functional status and demonstrates the ability to make significant improvements in function in a reasonable and predictable amount of time.     Precautions / Restrictions Precautions Precautions: Fall Recall of  Precautions/Restrictions: Intact Restrictions Weight Bearing Restrictions Per Provider Order: Yes RLE Weight Bearing Per Provider Order: Weight bearing as tolerated      Mobility  Bed Mobility Overal bed mobility: Independent             General bed mobility comments: Pt demonstrates supine <>sitting at EOB with independence. No use of bed rails.    Transfers Overall transfer level: Needs assistance Equipment used: Rolling walker (2 wheels) Transfers: Sit to/from Stand Sit to Stand: Contact guard assist           General transfer comment: Pushed from bed to stand.    Ambulation/Gait Ambulation/Gait assistance: Contact guard assist Gait Distance (Feet): 3 Feet Assistive device: Rolling walker (2 wheels)         General Gait Details: Side stepping at EOB. Pt demos decreased dorsiflexion on LLE however is still able to clear feet. Appropriate weight bearing through RW. Min verbal cuing for sequencing.  Stairs            Wheelchair Mobility     Tilt Bed    Modified Rankin (Stroke Patients Only)       Balance Overall balance assessment: Needs assistance Sitting-balance support: Feet supported Sitting balance-Leahy Scale: Normal Sitting balance - Comments: Initially pt's feet were hanging off bed and pt was able to maintain seated balance without UE support. With cuing, pt able to scoot forward to obtain wider BOS from feet.   Standing balance support: Bilateral upper extremity supported, During functional activity, Reliant on assistive device for balance Standing balance-Leahy Scale: Good Standing balance comment: No LOB using RW and standing at EOB.  Pertinent Vitals/Pain Pain Assessment Pain Assessment: No/denies pain    Home Living Family/patient expects to be discharged to:: Private residence Living Arrangements: Spouse/significant other Available Help at Discharge: Family Type of Home: House Home  Access: Stairs to enter Entrance Stairs-Rails: Right Entrance Stairs-Number of Steps: 3   Home Layout: One level Home Equipment: Cane - single point Additional Comments: Pt owns SPC, but does not use it for ambulation    Prior Function Prior Level of Function : Independent/Modified Independent             Mobility Comments: No use of AD for ambulation. Pt reports he is still driving ADLs Comments: Independent with ADLs     Extremity/Trunk Assessment   Upper Extremity Assessment Upper Extremity Assessment: Overall WFL for tasks assessed    Lower Extremity Assessment Lower Extremity Assessment: LLE deficits/detail;Overall WFL for tasks assessed;RLE deficits/detail RLE Deficits / Details: surgical side RLE Sensation: decreased light touch (Decreased light touch around surgical site. Able to feel light touch distal to surgical site and superior to surgical site) LLE Deficits / Details: Lacking full active dorsiflexion. LLE Sensation: decreased light touch    Cervical / Trunk Assessment Cervical / Trunk Assessment: Normal  Communication   Communication Communication: No apparent difficulties    Cognition Arousal: Alert Behavior During Therapy: WFL for tasks assessed/performed   PT - Cognitive impairments: No apparent impairments                       PT - Cognition Comments: Pt is pleasant and agreeable to therapy session Following commands: Intact       Cueing Cueing Techniques: Verbal cues, Visual cues     General Comments Pt education for RLE positioning to encourage knee extension PROM and prevent heel pressure.     Exercises Total Joint Exercises Ankle Circles/Pumps: AROM, 10 reps, Both Quad Sets: Strengthening, 5 reps, Both Straight Leg Raises: Strengthening, 5 reps, Both (Pt able to perform R SLR without physical assistance) Knee Flexion: AROM, 5 reps, Right Goniometric ROM: 5-90 degrees  Per HEP handout.   Assessment/Plan    PT Assessment  Patient needs continued PT services  PT Problem List Decreased strength;Decreased range of motion;Decreased balance;Decreased mobility       PT Treatment Interventions DME instruction;Gait training;Stair training;Functional mobility training;Therapeutic activities;Therapeutic exercise;Neuromuscular re-education;Patient/family education    PT Goals (Current goals can be found in the Care Plan section)  Acute Rehab PT Goals Patient Stated Goal: To get back to hiking PT Goal Formulation: With patient Time For Goal Achievement: 05/02/24 Potential to Achieve Goals: Good    Frequency BID     Co-evaluation               AM-PAC PT 6 Clicks Mobility  Outcome Measure Help needed turning from your back to your side while in a flat bed without using bedrails?: None Help needed moving from lying on your back to sitting on the side of a flat bed without using bedrails?: None Help needed moving to and from a bed to a chair (including a wheelchair)?: A Little Help needed standing up from a chair using your arms (e.g., wheelchair or bedside chair)?: A Little Help needed to walk in hospital room?: None Help needed climbing 3-5 steps with a railing? : A Little 6 Click Score: 21    End of Session Equipment Utilized During Treatment: Gait belt Activity Tolerance: Patient tolerated treatment well Patient left: in bed;with call bell/phone within reach Nurse Communication: Mobility  status PT Visit Diagnosis: Muscle weakness (generalized) (M62.81);Difficulty in walking, not elsewhere classified (R26.2)    Time: 8391-8358 PT Time Calculation (min) (ACUTE ONLY): 33 min   Charges:                 Shayden Bobier, SPT   Myking Sar 04/18/2024, 5:27 PM

## 2024-04-18 NOTE — Interval H&P Note (Signed)
 History and Physical Interval Note:  04/18/2024 6:08 AM  Kenneth Pena  has presented today for surgery, with the diagnosis of Primary osteoarthritis of right knee.  The various methods of treatment have been discussed with the patient and family. After consideration of risks, benefits and other options for treatment, the patient has consented to  Procedure(s): ARTHROPLASTY, KNEE, TOTAL, USING IMAGELESS COMPUTER-ASSISTED NAVIGATION (Right) as a surgical intervention.  The patient's history has been reviewed, patient examined, no change in status, stable for surgery.  I have reviewed the patient's chart and labs.  Questions were answered to the patient's satisfaction.     [name]

## 2024-04-18 NOTE — Progress Notes (Signed)
 Patient is not able to walk the distance required to go the bathroom, or he/she is unable to safely negotiate stairs required to access the bathroom.  A 3in1 BSC will alleviate this problem   Amenda Duclos P. Angie Fava M.D.

## 2024-04-19 DIAGNOSIS — M1711 Unilateral primary osteoarthritis, right knee: Secondary | ICD-10-CM | POA: Diagnosis not present

## 2024-04-19 MED ORDER — TRAMADOL HCL 50 MG PO TABS: 50.0000 mg | ORAL_TABLET | ORAL | 0 refills | Status: AC | PRN

## 2024-04-19 MED ORDER — ONDANSETRON HCL 4 MG PO TABS: 4.0000 mg | ORAL_TABLET | Freq: Four times a day (QID) | ORAL | 0 refills | Status: AC | PRN

## 2024-04-19 MED ORDER — ASPIRIN 81 MG PO CHEW: 81.0000 mg | CHEWABLE_TABLET | Freq: Two times a day (BID) | ORAL | 0 refills | Status: AC

## 2024-04-19 MED ORDER — OXYCODONE HCL 5 MG PO TABS: 5.0000 mg | ORAL_TABLET | ORAL | 0 refills | Status: AC | PRN

## 2024-04-19 MED ORDER — CELECOXIB 200 MG PO CAPS: 200.0000 mg | ORAL_CAPSULE | Freq: Two times a day (BID) | ORAL | 0 refills | Status: AC

## 2024-04-19 NOTE — Anesthesia Postprocedure Evaluation (Signed)
 Anesthesia Post Note  Patient: Kenneth Pena  Procedure(s) Performed: ARTHROPLASTY, KNEE, TOTAL, USING IMAGELESS COMPUTER-ASSISTED NAVIGATION (Right: Knee)  Patient location during evaluation: Nursing Unit Anesthesia Type: Combined General/Spinal Level of consciousness: oriented and awake and alert Pain management: pain level controlled Vital Signs Assessment: post-procedure vital signs reviewed and stable Respiratory status: spontaneous breathing and respiratory function stable Cardiovascular status: blood pressure returned to baseline and stable Postop Assessment: no headache, no backache, no apparent nausea or vomiting and patient able to bend at knees Anesthetic complications: no   No notable events documented.   Last Vitals:  Vitals:   04/19/24 0628 04/19/24 0801  BP: (!) 141/79 (!) 159/86  Pulse: (!) 48 (!) 46  Resp: 18 16  Temp: 36.8 C 36.9 C  SpO2: 99% 98%    Last Pain:  Vitals:   04/19/24 0801  TempSrc: Oral  PainSc:                  Debby Mines

## 2024-04-19 NOTE — Progress Notes (Signed)
 Subjective: 1 Day Post-Op Procedure(s) (LRB): ARTHROPLASTY, KNEE, TOTAL, USING IMAGELESS COMPUTER-ASSISTED NAVIGATION (Right) Patient reports pain as mild.   Patient is well, and has had no acute complaints or problems Plan is to go Home after hospital stay. Negative for chest pain and shortness of breath Fever: no Gastrointestinal:Negative for nausea and vomiting Patient is passing gas without pain. Moderate drainage from the hemovac, reported of drainage.  Vitals are stable.    Objective: Vital signs in last 24 hours: Temp:  [97.2 F (36.2 C)-98.4 F (36.9 C)] 98.4 F (36.9 C) (08/30 0801) Pulse Rate:  [43-57] 46 (08/30 0801) Resp:  [14-18] 16 (08/30 0801) BP: (114-163)/(65-86) 159/86 (08/30 0801) SpO2:  [97 %-100 %] 98 % (08/30 0801)  Intake/Output from previous day:  Intake/Output Summary (Last 24 hours) at 04/19/2024 0918 Last data filed at 04/19/2024 0655 Gross per 24 hour  Intake 3619.51 ml  Output 4150 ml  Net -530.49 ml    Intake/Output this shift: No intake/output data recorded.  Labs: No results for input(s): HGB in the last 72 hours. No results for input(s): WBC, RBC, HCT, PLT in the last 72 hours. No results for input(s): NA, K, CL, CO2, BUN, CREATININE, GLUCOSE, CALCIUM  in the last 72 hours. No results for input(s): LABPT, INR in the last 72 hours.   EXAM General - Patient is Alert, Appropriate, and Oriented Extremity - ABD soft Neurovascular intact Dorsiflexion/Plantar flexion intact No cellulitis present Compartment soft Dressing/Incision - Bulky dressing intact to the right leg. Bulky dressing was removed, no significant swelling seen, no hematoma formed. Mild spotting to the aquafoam dressing. Drain was removed, no drainage was seen.  Monitored for several minutes and no drainage occurred. 4x4 with tegaderm was applied in addition to a mild bulky dressing and ACE wrap. Motor Function - intact, moving foot and  toes well on exam.  Abdomen soft with intact bowel sounds.  Past Medical History:  Diagnosis Date   Adenomatous polyp    Coronary artery disease of native artery of native heart with stable angina pectoris (HCC) 09/09/2020   a.) LHC/PCI 09/09/2020: 95% oLAD (2.75 x 15 mm Resolute Onyx DES)   Degenerative arthritis of right knee 03/2024   Dental root implant present (multiple)    Erectile dysfunction    a.) on PDE5i (tadalafil)   Essential hypertension    GERD (gastroesophageal reflux disease)    HLD (hyperlipidemia)    Kidney stones    Long-term use of aspirin  therapy    Shingles    Sinus bradycardia     Assessment/Plan: 1 Day Post-Op Procedure(s) (LRB): ARTHROPLASTY, KNEE, TOTAL, USING IMAGELESS COMPUTER-ASSISTED NAVIGATION (Right) Principal Problem:   History of total knee arthroplasty, right  Estimated body mass index is 23.06 kg/m as calculated from the following:   Height as of this encounter: 6' (1.829 m).   Weight as of this encounter: 77.1 kg. Advance diet Up with therapy D/C IV fluids when tolerating po intake.  Labs and vitals reviewed.  HR 46, BP 156/86.  HR appears to be his baseline. Has cleared goals with PT at this time. Moderate drainage reported from hemovac, was removed without issue.  No drainage was seen after removal, as a precaution a small bulky dressing was applied and he was instructed to leave this one for 48 hours. Plan for discharge home today with HHPT. Patient is passing was without issue.  DVT Prophylaxis - TED hose and Aspirin  Weight-Bearing as tolerated to right leg  J. Gustavo Level, PA-C [facility] 04/19/2024, 9:18 AM

## 2024-04-19 NOTE — Discharge Summary (Signed)
 Physician Discharge Summary  Patient ID: Kenneth Pena MRN: [id number] DOB/AGE: Jan 02, 1951 73 y.o.  Admit date: 04/18/2024 Discharge date: 04/19/2024  Admission Diagnoses:  Primary osteoarthritis of right knee [M17.11] History of total knee arthroplasty, right [Z96.651]   Discharge Diagnoses: Patient Active Problem List   Diagnosis Date Noted   History of total knee arthroplasty, right 04/18/2024   Primary osteoarthritis of right knee 03/28/2024   Hypertension, essential 12/14/2021   Coronary artery disease of native artery of native heart with stable angina pectoris (HCC) 09/27/2020   S/P drug eluting coronary stent placement 09/13/2020   Chest tightness 08/24/2020   Hx of adenomatous polyp of colon 02/14/2019    Past Medical History:  Diagnosis Date   Adenomatous polyp    Coronary artery disease of native artery of native heart with stable angina pectoris (HCC) 09/09/2020   a.) LHC/PCI 09/09/2020: 95% oLAD (2.75 x 15 mm Resolute Onyx DES)   Degenerative arthritis of right knee 03/2024   Dental root implant present (multiple)    Erectile dysfunction    a.) on PDE5i (tadalafil)   Essential hypertension    GERD (gastroesophageal reflux disease)    HLD (hyperlipidemia)    Kidney stones    Long-term use of aspirin  therapy    Shingles    Sinus bradycardia      Transfusion: None.   Consultants (if any):   Discharged Condition: Improved  Hospital Course: PRITESH SOBECKI is an 73 y.o. male who was admitted 04/18/2024 with a diagnosis of degenerative arthrosis of the right knee, primary and went to the operating room on 04/18/2024 and underwent the above named procedures.    Surgeries: Procedure(s): ARTHROPLASTY, KNEE, TOTAL, USING IMAGELESS COMPUTER-ASSISTED NAVIGATION on 04/18/2024 Patient tolerated the surgery well. Taken to PACU where he was stabilized and then transferred to the orthopedic floor.  Started on Aspirin  81mg  every 12 hrs. Heels elevated on bed with  rolled towels. No evidence of DVT. Negative Homan. Physical therapy started on day #1 for gait training and transfer. OT started day #1 for ADL and assisted devices.  Patient's IV and hemovac were removed on POD1.  Implants: DePuy Attune size 8 posterior stabilized femoral component (cemented), size 8 rotating platform tibial component (cemented), 41 mm medialized dome patella (cemented), and a 5 mm stabilized rotating platform polyethylene insert.   He was given perioperative antibiotics:  Anti-infectives (From admission, onward)    Start     Dose/Rate Route Frequency Ordered Stop   04/18/24 1400  ceFAZolin  (ANCEF ) IVPB 2g/100 mL premix        2 g 200 mL/hr over 30 Minutes Intravenous Every 6 hours 04/18/24 1248 04/18/24 2051   04/18/24 0630  ceFAZolin  (ANCEF ) IVPB 2g/100 mL premix        2 g 200 mL/hr over 30 Minutes Intravenous On call to O.R. 04/18/24 9377 04/18/24 0740     .  He was given sequential compression devices, early ambulation, and Aspirin  for DVT prophylaxis.  He benefited maximally from the hospital stay and there were no complications.    Recent vital signs:  Vitals:   04/19/24 0628 04/19/24 0801  BP: (!) 141/79 (!) 159/86  Pulse: (!) 48 (!) 46  Resp: 18 16  Temp: 98.3 F (36.8 C) 98.4 F (36.9 C)  SpO2: 99% 98%    Recent laboratory studies:  Lab Results  Component Value Date   HGB 14.2 04/09/2024   Lab Results  Component Value Date   WBC 4.2 04/09/2024   PLT  190 04/09/2024   No results found for: INR Lab Results  Component Value Date   NA 136 04/09/2024   K 4.8 04/09/2024   CL 103 04/09/2024   CO2 26 04/09/2024   BUN 22 04/09/2024   CREATININE 0.88 04/09/2024   GLUCOSE 85 04/09/2024    Discharge Medications:   Allergies as of 04/19/2024   No Known Allergies      Medication List     STOP taking these medications    aspirin  EC 81 MG tablet Replaced by: aspirin  81 MG chewable tablet       TAKE these medications    aspirin   81 MG chewable tablet Chew 1 tablet (81 mg total) by mouth 2 (two) times daily. Replaces: aspirin  EC 81 MG tablet   atorvastatin  80 MG tablet Commonly known as: LIPITOR  Take 1 tablet (80 mg total) by mouth daily.   celecoxib  200 MG capsule Commonly known as: CELEBREX  Take 1 capsule (200 mg total) by mouth 2 (two) times daily.   lisinopril  10 MG tablet Commonly known as: ZESTRIL  Take 10 mg by mouth in the morning.   ondansetron  4 MG tablet Commonly known as: ZOFRAN  Take 1 tablet (4 mg total) by mouth every 6 (six) hours as needed for nausea.   oxyCODONE  5 MG immediate release tablet Commonly known as: Oxy IR/ROXICODONE  Take 1 tablet (5 mg total) by mouth every 4 (four) hours as needed for severe pain (pain score 7-10).   pantoprazole  40 MG tablet Commonly known as: PROTONIX  Take 40 mg by mouth in the morning.   tadalafil 5 MG tablet Commonly known as: CIALIS Take 5 mg by mouth in the morning.   traMADol  50 MG tablet Commonly known as: ULTRAM  Take 1-2 tablets (50-100 mg total) by mouth every 4 (four) hours as needed for moderate pain (pain score 4-6).               Durable Medical Equipment  (From admission, onward)           Start     Ordered   04/18/24 1139  DME Walker rolling  Once       Question:  Patient needs a walker to treat with the following condition  Answer:  Total knee replacement status   04/18/24 1138   04/18/24 1139  DME Bedside commode  Once       Comments: Patient is not able to walk the distance required to go the bathroom, or he/she is unable to safely negotiate stairs required to access the bathroom.  A 3in1 BSC will alleviate this problem  Question:  Patient needs a bedside commode to treat with the following condition  Answer:  Total knee replacement status   04/18/24 1138            Diagnostic Studies: DG Knee Right Port Result Date: 04/18/2024 CLINICAL DATA:  Status post right knee arthroplasty. EXAM: PORTABLE RIGHT KNEE - 1-2  VIEW COMPARISON:  None Available. FINDINGS: Normal alignment immediately status post right total knee arthroplasty with normal appearance of femoral and tibial arthroplasty components. Surgical drains present in the superior joint space. IMPRESSION: Normal appearance and alignment immediately status post right total knee arthroplasty. Electronically Signed   By: Marcey Moan M.D.   On: 04/18/2024 12:10   Disposition: Plan for discharge home today with HHPT.   Follow-up Information     Drake Chew, PA-C Follow up on 05/02/2024.   Specialty: Orthopedic Surgery Why: at 1:15pm Contact information: [address]  [place] [address] [phone]         Mardee Lynwood SQUIBB, MD Follow up on 06/05/2024.   Specialty: Orthopedic Surgery Why: at 10:30am3 Contact information: [address] [facility] [address] [facility] [place] [address] [phone]                Signed: Lynwood LITTIE Level PA-C 04/19/2024, 9:27 AM

## 2024-04-19 NOTE — Progress Notes (Signed)
 Physical Therapy Treatment Patient Details Name: Kenneth Pena MRN: [id number] DOB: 02/24/1951 Today's Date: 04/19/2024   History of Present Illness 73 y/o pt is POD 0 (04/18/2024) from R TKA. PMH includes: HTN, sinus bradycardia, CAD, and OA.    PT Comments  Pt was long sitting in bed with RN at bedside. He is A and O x4. Pleasant and motivated. Educated on bone foam use and that he did not need to wear 24/7 but to wear as often as tolerated. Pt was easily and safely able to exit bed, stand to RW, and tolerate ambulation ~ 200 ft. No LOB or safety concern. Gait improved from antalgic step to pattern to step through pattern. Heel strike to toe off improved with vcing and practice. Pt also demonstrated safe performance of stairs to simulate home entry. Overall pt is progressing well. Recommend continued skilled PT at DC to maximize independence and safety with all ADLs.   If plan is discharge home, recommend the following: A little help with walking and/or transfers;A little help with bathing/dressing/bathroom;Assistance with cooking/housework;Help with stairs or ramp for entrance     Equipment Recommendations  None recommended by PT (pt has personal DME in room already)       Precautions / Restrictions Precautions Precautions: Fall Recall of Precautions/Restrictions: Intact Restrictions Weight Bearing Restrictions Per Provider Order: Yes RLE Weight Bearing Per Provider Order: Weight bearing as tolerated     Mobility  Bed Mobility Overal bed mobility: Modified Independent  General bed mobility comments: Pt was able to exit bed without physical assistance.    Transfers Overall transfer level: Needs assistance Equipment used: Rolling walker (2 wheels) Transfers: Sit to/from Stand Sit to Stand: Supervision  General transfer comment: stood from lowest bed height without physical assistance. vcs only for technique improvements    Ambulation/Gait Ambulation/Gait assistance:  Supervision Gait Distance (Feet): 200 Feet Assistive device: Rolling walker (2 wheels) Gait Pattern/deviations: Step-to pattern, Step-through pattern Gait velocity: decreased  General Gait Details: pt progress from step to pattern to step through gait sequencing. NO LOB or safety concern. improved heel strike to toe off by end of session.   Stairs Stairs: Yes Stairs assistance: Supervision Stair Management: One rail Right, Step to pattern, Sideways, Forwards Number of Stairs: 3 General stair comments: Pt demonstrated safe performance of ascending/descending stairs to simulate home entry.   Balance Overall balance assessment: Needs assistance Sitting-balance support: Feet supported Sitting balance-Leahy Scale: Normal     Standing balance support: Bilateral upper extremity supported, During functional activity Standing balance-Leahy Scale: Good Standing balance comment: No LOB using RW and standing at EOB.       Communication Communication Communication: No apparent difficulties  Cognition Arousal: Alert Behavior During Therapy: WFL for tasks assessed/performed   PT - Cognitive impairments: No apparent impairments    PT - Cognition Comments: Pt is pleasant and agreeable to therapy session Following commands: Intact      Cueing Cueing Techniques: Verbal cues, Visual cues  Exercises Total Joint Exercises Ankle Circles/Pumps: AROM, 10 reps Knee Flexion: AROM, AAROM, 5 reps, Seated Goniometric ROM: 3-93        Pertinent Vitals/Pain Pain Assessment Pain Assessment: No/denies pain     PT Goals (current goals can now be found in the care plan section) Acute Rehab PT Goals Patient Stated Goal: To get back to hiking Progress towards PT goals: Progressing toward goals    Frequency    BID       AM-PAC PT 6 Clicks Mobility  Outcome Measure  Help needed turning from your back to your side while in a flat bed without using bedrails?: None Help needed moving  from lying on your back to sitting on the side of a flat bed without using bedrails?: None Help needed moving to and from a bed to a chair (including a wheelchair)?: A Little Help needed standing up from a chair using your arms (e.g., wheelchair or bedside chair)?: A Little Help needed to walk in hospital room?: A Little Help needed climbing 3-5 steps with a railing? : A Little 6 Click Score: 20    End of Session   Activity Tolerance: Patient tolerated treatment well Patient left: in chair;with call bell/phone within reach Nurse Communication: Mobility status PT Visit Diagnosis: Muscle weakness (generalized) (M62.81);Difficulty in walking, not elsewhere classified (R26.2)     Time: 9191-9166 PT Time Calculation (min) (ACUTE ONLY): 25 min  Charges:    $Gait Training: 8-22 mins $Therapeutic Exercise: 8-22 mins PT General Charges $$ ACUTE PT VISIT: 1 Visit                     Rankin Essex PTA 04/19/24, 8:44 AM

## 2024-04-19 NOTE — Evaluation (Signed)
 Occupational Therapy Evaluation Patient Details Name: Kenneth Pena MRN: [id number] DOB: 23-Jan-1951 Today's Date: 04/19/2024   History of Present Illness   73 y/o pt is POD 1 (04/18/2024) from R TKA. PMH includes: HTN, sinus bradycardia, CAD, and OA.     Clinical Impressions Pt seen for OT evaluation this date, POD#1 from above surgery. Pt was independent in all ADLs prior to surgery, with no DME use at baseline. Pt is eager to return to PLOF with less pain and improved safety and independence. Pt currently requires minimal assist for LB dressing while in seated position due to pain and limited AROM of R knee. Pt amb within room into BR to complete toilet transfer with CGA-progressing to supervision with demonstrated safety awareness and DME management of RW. Pt and his wife were instructed in polar care mgt, falls prevention strategies, home/routines modifications, DME/AE for LB bathing and dressing tasks, and compression stocking mgt. Handout provided, pt verbalized understanding throughout education. Do not currently anticipate any OT needs following this hospitalization. OT will sign off, please re consult if further acute OT needs arise.      If plan is discharge home, recommend the following:   A little help with bathing/dressing/bathroom;Assistance with cooking/housework     Functional Status Assessment   Patient has had a recent decline in their functional status and demonstrates the ability to make significant improvements in function in a reasonable and predictable amount of time.     Equipment Recommendations   BSC/3in1;Other (comment) (Rolling walker)     Recommendations for Other Services         Precautions/Restrictions   Precautions Precautions: Fall Recall of Precautions/Restrictions: Intact Restrictions Weight Bearing Restrictions Per Provider Order: Yes RLE Weight Bearing Per Provider Order: Weight bearing as tolerated     Mobility Bed Mobility                General bed mobility comments: NT in recliner pre/post session    Transfers Overall transfer level: Needs assistance Equipment used: Rolling walker (2 wheels) Transfers: Sit to/from Stand Sit to Stand: Supervision           General transfer comment: STS from various height surfaces with no phyical assistance including low toilet seat, with use of grab bar      Balance Overall balance assessment: Needs assistance Sitting-balance support: Feet supported Sitting balance-Leahy Scale: Normal Sitting balance - Comments: Steady dynamic reaching during LB dressing tasks   Standing balance support: Bilateral upper extremity supported, During functional activity Standing balance-Leahy Scale: Good Standing balance comment: Static standing with RW, no LOB noted                           ADL either performed or assessed with clinical judgement   ADL Overall ADL's : Needs assistance/impaired                 Upper Body Dressing : Set up;Standing   Lower Body Dressing: Minimal assistance;Sit to/from stand   Toilet Transfer: Ambulation;Rolling walker (2 wheels);Contact guard assist;Supervision/safety           Functional mobility during ADLs: Rolling walker (2 wheels);Contact guard assist General ADL Comments: CGA progressing to supervision during ADL tasks, setupA donning loose fitting shorts, MINA donning RLE sock (wife assisted)  Pertinent Vitals/Pain Pain Assessment Pain Assessment: 0-10 Pain Score: 1  Pain Location: operation site Pain Descriptors / Indicators: Operative site guarding, Aching Pain Intervention(s): Limited activity within patient's tolerance, Monitored during session     Extremity/Trunk Assessment Upper Extremity Assessment Upper Extremity Assessment: Overall WFL for tasks assessed   Lower Extremity Assessment Lower Extremity Assessment: Defer to PT evaluation;Generalized  weakness;RLE deficits/detail RLE Deficits / Details: Anticipated deficits POD #1 L TKA   Cervical / Trunk Assessment Cervical / Trunk Assessment: Normal   Communication Communication Communication: No apparent difficulties   Cognition Arousal: Alert Behavior During Therapy: WFL for tasks assessed/performed Cognition: No apparent impairments             OT - Cognition Comments: A/Ox4                 Following commands: Intact       Cueing  General Comments   Cueing Techniques: Verbal cues;Visual cues  Dried blood observered through post operative dressing, contained within dressing. - RN aware   Exercises Exercises: Other exercises Other Exercises Other Exercises: Edu: Role of OT eval, safe ADL completion with use of DME for functional mobility, compression sock management, LB dressing techniques         Home Living Family/patient expects to be discharged to:: Private residence Living Arrangements: Spouse/significant other Available Help at Discharge: Family Type of Home: House Home Access: Stairs to enter Secretary/administrator of Steps: 3 Entrance Stairs-Rails: Right Home Layout: One level     Bathroom Shower/Tub: Chief Strategy Officer: Handicapped height Bathroom Accessibility: Yes How Accessible: Accessible via wheelchair Home Equipment: Cane - single point   Additional Comments: Pt owns SPC, but does not use it for ambulation      Prior Functioning/Environment Prior Level of Function : Independent/Modified Independent;Driving             Mobility Comments: No use of AD for ambulation. ADLs Comments: Independent with ADLs                 OT Goals(Current goals can be found in the care plan section)   Acute Rehab OT Goals Patient Stated Goal: Return home with less knee pain OT Goal Formulation: With patient Time For Goal Achievement: 05/03/24 Potential to Achieve Goals: Good   AM-PAC OT 6 Clicks Daily Activity      Outcome Measure Help from another person eating meals?: None Help from another person taking care of personal grooming?: None Help from another person toileting, which includes using toliet, bedpan, or urinal?: None Help from another person bathing (including washing, rinsing, drying)?: A Little Help from another person to put on and taking off regular upper body clothing?: None Help from another person to put on and taking off regular lower body clothing?: A Little 6 Click Score: 22   End of Session Equipment Utilized During Treatment: Rolling walker (2 wheels) Nurse Communication: Mobility status  Activity Tolerance: Patient tolerated treatment well Patient left: in chair;with call bell/phone within reach;with family/visitor present                   Time: 1001-1020 OT Time Calculation (min): 19 min Charges:  OT General Charges $OT Visit: 1 Visit OT Evaluation $OT Eval Low Complexity: 1 Low OT Treatments $Self Care/Home Management : 8-22 mins  Larraine Colas M.S. OTR/L  04/19/24, 10:36 AM

## 2024-04-19 NOTE — Plan of Care (Signed)

## 2024-04-19 NOTE — Plan of Care (Signed)

## 2024-04-19 NOTE — Progress Notes (Signed)
 All discharge criteria met with successful removal of PIV.

## 2024-04-19 NOTE — Plan of Care (Signed)
  Problem: Nutrition: Goal: Adequate nutrition will be maintained Outcome: Progressing   Problem: Elimination: Goal: Will not experience complications related to bowel motility Outcome: Progressing   Problem: Education: Goal: Knowledge of General Education information will improve Description: Including pain rating scale, medication(s)/side effects and non-pharmacologic comfort measures 04/19/2024 1024 by Claudene Nonnie LABOR, RN Outcome: Adequate for Discharge 04/19/2024 9071 by Claudene Nonnie LABOR, RN Outcome: Progressing   Problem: Clinical Measurements: Goal: Ability to maintain clinical measurements within normal limits will improve Outcome: Adequate for Discharge   Problem: Activity: Goal: Risk for activity intolerance will decrease 04/19/2024 1024 by Claudene Nonnie LABOR, RN Outcome: Adequate for Discharge 04/19/2024 9071 by Claudene Nonnie LABOR, RN Outcome: Progressing

## 2024-04-22 ENCOUNTER — Encounter: Payer: Self-pay | Admitting: Orthopedic Surgery

## 2024-07-22 NOTE — Anesthesia Preprocedure Evaluation (Signed)
 Anesthesia Evaluation  Patient identified by MRN, date of birth, ID band Patient awake    Reviewed: Allergy & Precautions, H&P , NPO status , Patient's Chart, lab work & pertinent test results  Airway Mallampati: II  TM Distance: >3 FB Neck ROM: full    Dental no notable dental hx.    Pulmonary neg pulmonary ROS   Pulmonary exam normal        Cardiovascular hypertension, + CAD and + Cardiac Stents  Normal cardiovascular exam     Neuro/Psych negative neurological ROS  negative psych ROS   GI/Hepatic negative GI ROS, Neg liver ROS,,,  Endo/Other  negative endocrine ROS    Renal/GU negative Renal ROS  negative genitourinary   Musculoskeletal   Abdominal   Peds  Hematology negative hematology ROS (+)   Anesthesia Other Findings Past Medical History: No date: Adenomatous polyp 09/09/2020: Coronary artery disease of native artery of native heart  with stable angina pectoris     Comment:  a.) LHC/PCI 09/09/2020: 95% oLAD (2.75 x 15 mm Resolute               Onyx DES) 03/2024: Degenerative arthritis of right knee No date: Dental root implant present (multiple) No date: Erectile dysfunction     Comment:  a.) on PDE5i (tadalafil) No date: Essential hypertension No date: GERD (gastroesophageal reflux disease) No date: HLD (hyperlipidemia) No date: Kidney stones No date: Long-term use of aspirin  therapy No date: Shingles No date: Sinus bradycardia  Past Surgical History: No date: COLONOSCOPY     Comment:  1997, 2005, 2015, 2020 09/13/2020: CORONARY STENT INTERVENTION; N/A     Comment:  Procedure: CORONARY STENT INTERVENTION;  Surgeon:               Florencio Cara BIRCH, MD;  Location: [facility];               Service: Cardiovascular;  Laterality: N/A; 1997: ESOPHAGOGASTRODUODENOSCOPY 04/18/2024: KNEE ARTHROPLASTY; Right     Comment:  Procedure: ARTHROPLASTY, KNEE, TOTAL, USING IMAGELESS                COMPUTER-ASSISTED NAVIGATION;  Surgeon: Mardee Lynwood SQUIBB,               MD;  Location: [facility];  Service: Orthopedics;                Laterality: Right; 09/13/2020: LEFT HEART CATH AND CORONARY ANGIOGRAPHY; Left     Comment:  Procedure: LEFT HEART CATH AND CORONARY ANGIOGRAPHY;                Surgeon: Florencio Cara BIRCH, MD;  Location: [facility]              CV [facility];  Service: Cardiovascular;  Laterality: Left; 03/28/2019: SHOULDER ARTHROSCOPY WITH OPEN ROTATOR CUFF REPAIR; Right     Comment:  Procedure: SHOULDER ARTHROSCOPIC SUBSCAPULARIS REPAIR,               MINI OPEN ROTATOR CUFF REPAIR, SUBACROMIAL DECOMPRESSION,              DISTAL CLAVICLE EXCISION AND BICEPS TENODESIS;  Surgeon:               Tobie Priest, MD;  Location: [facility];                Service: Orthopedics;  Laterality: Right;  ARTHREX BICEPS              1.9MM FIBERTAK, SWIVELOCK  4. OR 5.5MM, 3.9MM KNOTLESS              CORKSCREW FOR SUBSCAPULARIS STRYKER SPEED SUTURE               ANCHOR OTHER No date: WISDOM TOOTH EXTRACTION     Reproductive/Obstetrics negative OB ROS                              Anesthesia Physical Anesthesia Plan  ASA: 3  Anesthesia Plan: General   Post-op Pain Management:    Induction:   PONV Risk Score and Plan: Propofol  infusion and TIVA  Airway Management Planned:   Additional Equipment:   Intra-op Plan:   Post-operative Plan:   Informed Consent:      Dental Advisory Given  Plan Discussed with: CRNA and Surgeon  Anesthesia Plan Comments:          Anesthesia Quick Evaluation

## 2024-07-23 ENCOUNTER — Encounter: Payer: Self-pay | Admitting: Anesthesiology

## 2024-07-23 ENCOUNTER — Encounter
Admission: RE | Disposition: A | Discharge status: Home / Self Care | Payer: Self-pay | Source: Home / Self Care | Attending: Internal Medicine

## 2024-07-23 ENCOUNTER — Ambulatory Visit
Admission: RE | Admit: 2024-07-23 | Discharge: 2024-07-23 | Disposition: A | Attending: Internal Medicine | Admitting: Internal Medicine

## 2024-07-23 ENCOUNTER — Encounter: Payer: Self-pay | Admitting: Internal Medicine

## 2024-07-23 ENCOUNTER — Ambulatory Visit: Payer: Self-pay | Admitting: Anesthesiology

## 2024-07-23 HISTORY — PX: COLONOSCOPY: SHX5424

## 2024-07-23 SURGERY — COLONOSCOPY
Anesthesia: General

## 2024-07-23 MED ORDER — PROPOFOL 10 MG/ML IV BOLUS
INTRAVENOUS | Status: DC | PRN
  Administered 2024-07-23: 30 mg via INTRAVENOUS
  Administered 2024-07-23: 50 mg via INTRAVENOUS
  Administered 2024-07-23: 100 mg via INTRAVENOUS

## 2024-07-23 MED ORDER — SODIUM CHLORIDE 0.9 % IV SOLN: INTRAVENOUS | Status: DC

## 2024-07-23 MED ORDER — LIDOCAINE HCL (CARDIAC) PF 100 MG/5ML IV SOSY
PREFILLED_SYRINGE | INTRAVENOUS | Status: DC | PRN
  Administered 2024-07-23: 60 mg via INTRAVENOUS

## 2024-07-23 MED ORDER — PROPOFOL 500 MG/50ML IV EMUL
INTRAVENOUS | Status: DC | PRN
  Administered 2024-07-23: 125 ug/kg/min via INTRAVENOUS

## 2024-07-23 NOTE — Anesthesia Postprocedure Evaluation (Signed)
 Anesthesia Post Note  Patient: Kenneth Pena  Procedure(s) Performed: COLONOSCOPY  Patient location during evaluation: Endoscopy Anesthesia Type: General Level of consciousness: awake and alert Pain management: pain level controlled Vital Signs Assessment: post-procedure vital signs reviewed and stable Respiratory status: spontaneous breathing, nonlabored ventilation and respiratory function stable Cardiovascular status: blood pressure returned to baseline and stable Postop Assessment: no apparent nausea or vomiting Anesthetic complications: no   No notable events documented.   Last Vitals:  Vitals:   07/23/24 0940 07/23/24 0950  BP: (!) 96/40 112/72  Pulse: 67 (!) 50  Resp: (!) 21 18  Temp: (!) 35.8 C   SpO2: 96% 97%    Last Pain:  Vitals:   07/23/24 0940  TempSrc: Temporal                 Camellia Merilee Louder

## 2024-07-23 NOTE — Transfer of Care (Signed)
 Immediate Anesthesia Transfer of Care Note  Patient: Kenneth Pena [facility]  Procedure(s) Performed: COLONOSCOPY  Patient Location: Endoscopy Unit  Anesthesia Type:General  Level of Consciousness: drowsy, patient cooperative, and responds to stimulation  Airway & Oxygen Therapy: Patient Spontanous Breathing and Patient connected to nasal cannula oxygen  Post-op Assessment: Report given to RN and Post -op Vital signs reviewed and stable  Post vital signs: Reviewed and stable  Last Vitals:  Vitals Value Taken Time  BP 96/40 07/23/24 09:40  Temp 35.8 C 07/23/24 09:40  Pulse 61 07/23/24 09:42  Resp 11 07/23/24 09:42  SpO2 97 % 07/23/24 09:42  Vitals shown include unfiled device data.  Last Pain:  Vitals:   07/23/24 0940  TempSrc: Temporal         Complications: No notable events documented.

## 2024-07-23 NOTE — Interval H&P Note (Signed)
 History and Physical Interval Note:  07/23/2024 9:07 AM  Kenneth Pena  has presented today for surgery, with the diagnosis of Hx of adenomatous polyp of colon (Z86.0101).  The various methods of treatment have been discussed with the patient and family. After consideration of risks, benefits and other options for treatment, the patient has consented to  Procedure(s): COLONOSCOPY (N/A) as a surgical intervention.  The patient's history has been reviewed, patient examined, no change in status, stable for surgery.  I have reviewed the patient's chart and labs.  Questions were answered to the patient's satisfaction.     [place], Amena Dockham

## 2024-07-23 NOTE — H&P (Signed)
 Outpatient short stay form Pre-procedure 07/23/2024 9:05 AM Kenneth Pena K. Kenneth Pena, M.D.  Primary Physician: [name], M.D.  Reason for visit:  Hx adenomatous colon polyps - 2015  History of present illness:                            Patient presents for colonoscopy for a personal hx of colon polyps. The patient denies abdominal pain, abnormal weight loss or rectal bleeding.      Current Facility-Administered Medications:    0.9 %  sodium chloride  infusion, , Intravenous, Continuous, [place], [name], MD, Last Rate: 20 mL/hr at 07/23/24 0847, New Bag at 07/23/24 0847  Facility-Administered Medications Ordered in Other Encounters:    sodium chloride  flush (NS) 0.9 % injection 3 mL, 3 mL, Intravenous, Q12H, Fath, Vinie LABOR, MD  Medications Prior to Admission  Medication Sig Dispense Refill Last Dose/Taking   aspirin  81 MG chewable tablet Chew 1 tablet (81 mg total) by mouth 2 (two) times daily. 60 tablet 0 07/22/2024   atorvastatin  (LIPITOR ) 80 MG tablet Take 1 tablet (80 mg total) by mouth daily. 30 tablet 0 07/23/2024 Morning   lisinopril  (ZESTRIL ) 10 MG tablet Take 10 mg by mouth in the morning.   07/23/2024 Morning   pantoprazole  (PROTONIX ) 40 MG tablet Take 40 mg by mouth in the morning.   Past Week   celecoxib  (CELEBREX ) 200 MG capsule Take 1 capsule (200 mg total) by mouth 2 (two) times daily. (Patient not taking: Reported on 07/23/2024) 30 capsule 0 Not Taking   ondansetron  (ZOFRAN ) 4 MG tablet Take 1 tablet (4 mg total) by mouth every 6 (six) hours as needed for nausea. 30 tablet 0    oxyCODONE  (OXY IR/ROXICODONE ) 5 MG immediate release tablet Take 1 tablet (5 mg total) by mouth every 4 (four) hours as needed for severe pain (pain score 7-10). (Patient not taking: Reported on 07/23/2024) 30 tablet 0 Not Taking   tadalafil (CIALIS) 5 MG tablet Take 5 mg by mouth in the morning.      traMADol  (ULTRAM ) 50 MG tablet Take 1-2 tablets (50-100 mg total) by mouth every 4 (four) hours as needed  for moderate pain (pain score 4-6). (Patient not taking: Reported on 07/23/2024) 30 tablet 0 Not Taking     No Known Allergies   Past Medical History:  Diagnosis Date   Adenomatous polyp    Coronary artery disease of native artery of native heart with stable angina pectoris 09/09/2020   a.) LHC/PCI 09/09/2020: 95% oLAD (2.75 x 15 mm Resolute Onyx DES)   Degenerative arthritis of right knee 03/2024   Dental root implant present (multiple)    Erectile dysfunction    a.) on PDE5i (tadalafil)   Essential hypertension    GERD (gastroesophageal reflux disease)    HLD (hyperlipidemia)    Kidney stones    Long-term use of aspirin  therapy    Shingles    Sinus bradycardia     Review of systems:  Otherwise negative.    Physical Exam  Gen: Alert, oriented. Appears stated age.  HEENT: [place]/AT. PERRLA. Lungs: CTA, no wheezes. CV: RR nl S1, S2. Abd: soft, benign, no masses. BS+ Ext: No edema. Pulses 2+    Planned procedures: Proceed with colonoscopy. The patient understands the nature of the planned procedure, indications, risks, alternatives and potential complications including but not limited to bleeding, infection, perforation, damage to internal organs and possible oversedation/side effects from anesthesia. The patient agrees  and gives consent to proceed.  Please refer to procedure notes for findings, recommendations and patient disposition/instructions.     Kenneth Pena K. Kenneth Pena, M.D. Gastroenterology 07/23/2024  9:05 AM

## 2024-07-23 NOTE — Op Note (Signed)
 [facility] Gastroenterology Patient Name: Kenneth Pena Procedure Date: 07/23/2024 9:06 AM MRN: [id number] Account #: [id number] Date of Birth: 1951/01/16 Admit Type: Outpatient Age: 73 Room: [facility] ENDO ROOM 2 Gender: Male Note Status: Finalized Instrument Name: Colon Scope [id number] Procedure:             Colonoscopy Indications:           High risk colon cancer surveillance: Personal history                         of non-advanced adenoma Providers:             Elwyn Lowden K. Aundria MD, MD Referring MD:          Ophelia Sage, MD (Referring MD) Medicines:             Propofol  per Anesthesia Complications:         No immediate complications. Estimated blood loss: None. Procedure:             Pre-Anesthesia Assessment:                        - The risks and benefits of the procedure and the                         sedation options and risks were discussed with the                         patient. All questions were answered and informed                         consent was obtained.                        - Patient identification and proposed procedure were                         verified prior to the procedure by the nurse. The                         procedure was verified in the procedure room.                        - ASA Grade Assessment: II - A patient with mild                         systemic disease.                        - After reviewing the risks and benefits, the patient                         was deemed in satisfactory condition to undergo the                         procedure.                        After obtaining informed consent, the colonoscope was  passed under direct vision. Throughout the procedure,                         the patient's blood pressure, pulse, and oxygen                         saturations were monitored continuously. The was                         introduced through the anus and advanced to the the                          cecum, identified by appendiceal orifice and ileocecal                         valve. The colonoscopy was performed without                         difficulty. The patient tolerated the procedure well.                         The quality of the bowel preparation was good. The                         ileocecal valve, appendiceal orifice, and rectum were                         photographed. Findings:      The perianal and digital rectal examinations were normal. Pertinent       negatives include normal sphincter tone and no palpable rectal lesions.      Non-bleeding internal hemorrhoids were found during retroflexion. The       hemorrhoids were Grade I (internal hemorrhoids that do not prolapse).      No other significant abnormalities were identified in a careful       examination of the remainder of the colon. Impression:            - Non-bleeding internal hemorrhoids.                        - No specimens collected. Recommendation:        - Patient has a contact number available for                         emergencies. The signs and symptoms of potential                         delayed complications were discussed with the patient.                         Return to normal activities tomorrow. Written                         discharge instructions were provided to the patient.                        - Resume previous diet.                        -  Continue present medications.                        - You do NOT require further colon cancer screening                         measures (Annual stool testing (i.e. hemoccult, FIT,                         cologuard), sigmoidoscopy, colonoscopy or CT                         colonography). You should share this recommendation                         with your Primary Care provider.                        - Return to GI office PRN.                        - The findings and recommendations were discussed with                          the patient. Procedure Code(s):     --- Professional ---                        H9894, Colorectal cancer screening; colonoscopy on                         individual at high risk Diagnosis Code(s):     --- Professional ---                        K64.0, First degree hemorrhoids                        Z86.010, Personal history of colonic polyps CPT copyright 2022 [employer]. All rights reserved. The codes documented in this report are preliminary and upon coder review may  be revised to meet current compliance requirements. Ladell MARLA Boss MD, MD 07/23/2024 9:34:04 AM This report has been signed electronically. Number of Addenda: 0 Note Initiated On: 07/23/2024 9:06 AM Scope Withdrawal Time: 0 hours 6 minutes 6 seconds  Total Procedure Duration: 0 hours 11 minutes 17 seconds  Estimated Blood Loss:  Estimated blood loss: none.      [facility]
